# Patient Record
Sex: Female | Born: 1960 | Race: White | Hispanic: No | Marital: Single | Smoking: Former smoker
Health system: Southern US, Community
[De-identification: ages and names within clinical notes are randomized; demographics above are authoritative.]

## PROBLEM LIST (undated history)

## (undated) DIAGNOSIS — Z923 Personal history of irradiation: Secondary | ICD-10-CM

## (undated) DIAGNOSIS — F419 Anxiety disorder, unspecified: Secondary | ICD-10-CM

## (undated) DIAGNOSIS — C50919 Malignant neoplasm of unspecified site of unspecified female breast: Secondary | ICD-10-CM

## (undated) DIAGNOSIS — M51369 Other intervertebral disc degeneration, lumbar region without mention of lumbar back pain or lower extremity pain: Secondary | ICD-10-CM

## (undated) DIAGNOSIS — N879 Dysplasia of cervix uteri, unspecified: Secondary | ICD-10-CM

## (undated) DIAGNOSIS — M1990 Unspecified osteoarthritis, unspecified site: Secondary | ICD-10-CM

## (undated) DIAGNOSIS — D649 Anemia, unspecified: Secondary | ICD-10-CM

## (undated) DIAGNOSIS — M5136 Other intervertebral disc degeneration, lumbar region: Secondary | ICD-10-CM

## (undated) HISTORY — PX: WISDOM TOOTH EXTRACTION: SHX21

## (undated) HISTORY — PX: CERVIX SURGERY: SHX593

## (undated) HISTORY — DX: Anemia, unspecified: D64.9

---

## 2008-02-26 ENCOUNTER — Ambulatory Visit: Payer: Self-pay | Admitting: Unknown Physician Specialty

## 2008-12-19 ENCOUNTER — Ambulatory Visit: Payer: Self-pay | Admitting: Family Medicine

## 2009-03-05 ENCOUNTER — Ambulatory Visit: Payer: Self-pay | Admitting: Physician Assistant

## 2009-06-03 ENCOUNTER — Ambulatory Visit: Payer: Self-pay | Admitting: Internal Medicine

## 2009-06-20 ENCOUNTER — Ambulatory Visit: Payer: Self-pay | Admitting: Internal Medicine

## 2009-07-04 ENCOUNTER — Ambulatory Visit: Payer: Self-pay | Admitting: Internal Medicine

## 2010-02-19 ENCOUNTER — Ambulatory Visit: Payer: Self-pay | Admitting: Unknown Physician Specialty

## 2011-04-13 ENCOUNTER — Ambulatory Visit: Payer: Self-pay | Admitting: Family Medicine

## 2012-08-18 ENCOUNTER — Ambulatory Visit: Payer: Self-pay | Admitting: Family Medicine

## 2015-04-21 ENCOUNTER — Other Ambulatory Visit: Payer: Self-pay | Admitting: Family Medicine

## 2015-04-21 DIAGNOSIS — Z1231 Encounter for screening mammogram for malignant neoplasm of breast: Secondary | ICD-10-CM

## 2015-05-01 ENCOUNTER — Ambulatory Visit: Payer: Self-pay

## 2015-05-07 ENCOUNTER — Ambulatory Visit
Admission: RE | Admit: 2015-05-07 | Discharge: 2015-05-07 | Disposition: A | Discharge status: Home / Self Care | Payer: 59 | Source: Ambulatory Visit | Attending: Family Medicine | Admitting: Family Medicine

## 2015-05-07 DIAGNOSIS — Z1231 Encounter for screening mammogram for malignant neoplasm of breast: Secondary | ICD-10-CM | POA: Insufficient documentation

## 2016-04-05 DIAGNOSIS — Z923 Personal history of irradiation: Secondary | ICD-10-CM

## 2016-04-05 HISTORY — DX: Personal history of irradiation: Z92.3

## 2016-04-06 ENCOUNTER — Other Ambulatory Visit: Payer: Self-pay | Admitting: Family Medicine

## 2016-04-06 DIAGNOSIS — Z1231 Encounter for screening mammogram for malignant neoplasm of breast: Secondary | ICD-10-CM

## 2016-05-18 ENCOUNTER — Ambulatory Visit
Admission: RE | Admit: 2016-05-18 | Discharge: 2016-05-18 | Disposition: A | Discharge status: Home / Self Care | Payer: 59 | Source: Ambulatory Visit | Attending: Family Medicine | Admitting: Family Medicine

## 2016-05-18 DIAGNOSIS — Z1231 Encounter for screening mammogram for malignant neoplasm of breast: Secondary | ICD-10-CM | POA: Diagnosis not present

## 2016-05-18 DIAGNOSIS — R928 Other abnormal and inconclusive findings on diagnostic imaging of breast: Secondary | ICD-10-CM | POA: Diagnosis not present

## 2016-05-20 ENCOUNTER — Other Ambulatory Visit: Payer: Self-pay | Admitting: Family Medicine

## 2016-05-20 DIAGNOSIS — N6310 Unspecified lump in the right breast, unspecified quadrant: Secondary | ICD-10-CM

## 2016-05-20 DIAGNOSIS — R928 Other abnormal and inconclusive findings on diagnostic imaging of breast: Secondary | ICD-10-CM

## 2016-06-02 ENCOUNTER — Ambulatory Visit
Admission: RE | Admit: 2016-06-02 | Discharge: 2016-06-02 | Disposition: A | Discharge status: Home / Self Care | Payer: 59 | Source: Ambulatory Visit | Attending: Family Medicine | Admitting: Family Medicine

## 2016-06-02 DIAGNOSIS — N6310 Unspecified lump in the right breast, unspecified quadrant: Secondary | ICD-10-CM

## 2016-06-02 DIAGNOSIS — N6311 Unspecified lump in the right breast, upper outer quadrant: Secondary | ICD-10-CM | POA: Insufficient documentation

## 2016-06-02 DIAGNOSIS — R928 Other abnormal and inconclusive findings on diagnostic imaging of breast: Secondary | ICD-10-CM

## 2016-06-03 DIAGNOSIS — C50919 Malignant neoplasm of unspecified site of unspecified female breast: Secondary | ICD-10-CM

## 2016-06-03 HISTORY — DX: Malignant neoplasm of unspecified site of unspecified female breast: C50.919

## 2016-06-04 ENCOUNTER — Other Ambulatory Visit: Payer: Self-pay | Admitting: Family Medicine

## 2016-06-04 DIAGNOSIS — R928 Other abnormal and inconclusive findings on diagnostic imaging of breast: Secondary | ICD-10-CM

## 2016-06-04 DIAGNOSIS — N6310 Unspecified lump in the right breast, unspecified quadrant: Secondary | ICD-10-CM

## 2016-06-09 ENCOUNTER — Ambulatory Visit
Admission: RE | Admit: 2016-06-09 | Discharge: 2016-06-09 | Disposition: A | Discharge status: Home / Self Care | Payer: 59 | Source: Ambulatory Visit | Attending: Family Medicine | Admitting: Family Medicine

## 2016-06-09 DIAGNOSIS — C50411 Malignant neoplasm of upper-outer quadrant of right female breast: Secondary | ICD-10-CM | POA: Insufficient documentation

## 2016-06-09 DIAGNOSIS — N6310 Unspecified lump in the right breast, unspecified quadrant: Secondary | ICD-10-CM

## 2016-06-09 DIAGNOSIS — R928 Other abnormal and inconclusive findings on diagnostic imaging of breast: Secondary | ICD-10-CM

## 2016-06-11 HISTORY — PX: BREAST BIOPSY: SHX20

## 2016-06-14 ENCOUNTER — Encounter: Payer: Self-pay | Admitting: *Deleted

## 2016-06-15 DIAGNOSIS — C50411 Malignant neoplasm of upper-outer quadrant of right female breast: Secondary | ICD-10-CM | POA: Insufficient documentation

## 2016-06-15 NOTE — Progress Notes (Signed)
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  INTERVAL HISTORY: Patient is a 56 year old female who was noted to have an abnormality on routine screening mammogram. Subsequent ultrasound and biopsy revealed the above stated breast cancer. She is highly anxious and has some mild breast tenderness at the site of her biopsy, but otherwise feels well. She has no neurologic complaints. She denies any recent fevers or illnesses. She has a good appetite and denies weight loss. She has no chest pain or shortness of breath. She denies any nausea, vomiting, constipation, or diarrhea. She has no urinary complaints. Patient otherwise feels well and offers no further specific complaints.  REVIEW OF SYSTEMS:   Review of Systems  Constitutional: Negative.  Negative for fever, malaise/fatigue and weight loss.  Respiratory: Negative.  Negative for cough and shortness of breath.   Cardiovascular: Negative.  Negative for chest pain and leg swelling.  Gastrointestinal: Negative.  Negative for abdominal pain.  Genitourinary: Negative.   Musculoskeletal: Negative.   Neurological: Negative.   Psychiatric/Behavioral: The patient is nervous/anxious.     As per HPI. Otherwise, a complete review of systems is negative.  PAST MEDICAL HISTORY: Past Medical History:  Diagnosis Date  . Anemia     PAST SURGICAL HISTORY: No past surgical history on file.  FAMILY HISTORY: Family History  Problem Relation Age of Onset  . Breast cancer Neg Hx     ADVANCED DIRECTIVES (Y/N):  N  HEALTH MAINTENANCE: Social History  Substance Use Topics  . Smoking status: Not on file  . Smokeless tobacco: Not on file  .  Alcohol use Not on file     Colonoscopy:  PAP:  Bone density:  Lipid panel:  No Known Allergies  Current Outpatient Prescriptions  Medication Sig Dispense Refill  . ALPRAZolam (XANAX) 0.25 MG tablet Take 1 tablet by mouth daily as needed.    Marland Kitchen HYDROcodone-acetaminophen (NORCO) 10-325 MG tablet Take 1 tablet by mouth daily as needed.    . zolpidem (AMBIEN) 10 MG tablet Take 1 tablet by mouth at bedtime.     No current facility-administered medications for this visit.     OBJECTIVE: Vitals:   06/16/16 1559  BP: (!) 169/83  Pulse: 98  Resp: 18  Temp: 99.3 F (37.4 C)     There is no height or weight on file to calculate BMI.    ECOG FS:0 - Asymptomatic  General: Well-developed, well-nourished, no acute distress. Eyes: Pink conjunctiva, anicteric sclera. HEENT: Normocephalic, moist mucous membranes, clear oropharnyx. Breasts: Right breast with ecchymosis at biopsy site, no palpable masses. Axilla without lymphadenopathy. Lungs: Clear to auscultation bilaterally. Heart: Regular rate and rhythm. No rubs, murmurs, or gallops. Abdomen: Soft, nontender, nondistended. No organomegaly noted, normoactive bowel sounds. Musculoskeletal: No edema, cyanosis, or clubbing. Neuro: Alert, answering all questions appropriately. Cranial nerves grossly intact. Skin: No rashes or petechiae noted. Psych: Normal affect. Lymphatics: No cervical, calvicular, axillary or inguinal LAD.   LAB RESULTS:  No results found for: NA, K, CL, CO2, GLUCOSE, BUN, CREATININE, CALCIUM, PROT, ALBUMIN, AST, ALT, ALKPHOS, BILITOT, GFRNONAA, GFRAA  No results found for: WBC, NEUTROABS, HGB, HCT, MCV, PLT   STUDIES: Mm Digital Screening Bilateral  Result Date: 05/19/2016 CLINICAL DATA:  Screening.  EXAM: DIGITAL SCREENING BILATERAL MAMMOGRAM WITH CAD COMPARISON:  Previous exam(s). ACR Breast Density Category c: The breast tissue is heterogeneously dense, which may obscure small masses. FINDINGS: In the right  breast, a possible mass warrants further evaluation. In the left breast, no findings suspicious for malignancy. Images were processed with CAD. IMPRESSION: Further evaluation is suggested for possible mass in the right breast. RECOMMENDATION: Diagnostic mammogram and possibly ultrasound of the right breast. (Code:[id number]) The patient will be contacted regarding the findings, and additional imaging will be scheduled. BI-RADS CATEGORY  0: Incomplete. Need additional imaging evaluation and/or prior mammograms for comparison. Electronically Signed   By: Nolon Nations M.D.   On: 05/19/2016 12:32   US Breast Ltd Uni Right Inc Axilla  Result Date: 06/02/2016 CLINICAL DATA:  56 year old female for evaluation of possible right breast mass on screening mammogram. EXAM: 2D DIGITAL DIAGNOSTIC RIGHT MAMMOGRAM WITH CAD AND ADJUNCT TOMO ULTRASOUND RIGHT BREAST COMPARISON:  Previous exam(s). ACR Breast Density Category b: There are scattered areas of fibroglandular density. FINDINGS: 2D and 3D full field views of the right breast demonstrate an 8 mm irregular mass within the upper-outer right breast. No other abnormalities are identified within the right breast. Mammographic images were processed with CAD. On physical exam, focal thickening at the 10 o'clock position of the right breast 6 cm from the nipple is noted appear Targeted ultrasound is performed, showing an 8 x 6 x 8 mm irregular mass at the 10 o'clock position of the right breast 6 cm from the nipple. No abnormal right axillary lymph nodes are identified. IMPRESSION: Suspicious 8 mm mass in the upper-outer right breast. Tissue sampling is recommended. RECOMMENDATION: Ultrasound-guided right breast biopsy, which will be arranged. I have discussed the findings and recommendations with the patient. Results were also provided in writing at the conclusion of the visit. If applicable, a reminder letter will be sent to the patient regarding the next appointment. BI-RADS  CATEGORY  4: Suspicious. Electronically Signed   By: Margarette Canada M.D.   On: 06/02/2016 16:11   Mm Diag Breast Tomo Uni Right  Result Date: 06/02/2016 CLINICAL DATA:  56 year old female for evaluation of possible right breast mass on screening mammogram. EXAM: 2D DIGITAL DIAGNOSTIC RIGHT MAMMOGRAM WITH CAD AND ADJUNCT TOMO ULTRASOUND RIGHT BREAST COMPARISON:  Previous exam(s). ACR Breast Density Category b: There are scattered areas of fibroglandular density. FINDINGS: 2D and 3D full field views of the right breast demonstrate an 8 mm irregular mass within the upper-outer right breast. No other abnormalities are identified within the right breast. Mammographic images were processed with CAD. On physical exam, focal thickening at the 10 o'clock position of the right breast 6 cm from the nipple is noted appear Targeted ultrasound is performed, showing an 8 x 6 x 8 mm irregular mass at the 10 o'clock position of the right breast 6 cm from the nipple. No abnormal right axillary lymph nodes are identified. IMPRESSION: Suspicious 8 mm mass in the upper-outer right breast. Tissue sampling is recommended. RECOMMENDATION: Ultrasound-guided right breast biopsy, which will be arranged. I have discussed the findings and recommendations with the patient. Results were also provided in writing at the conclusion of the visit. If applicable, a reminder letter will be sent to the patient regarding the next appointment. BI-RADS CATEGORY  4: Suspicious. Electronically Signed   By: Margarette Canada M.D.   On: 06/02/2016 16:11   Mm Clip Placement Right  Result Date: 06/09/2016 CLINICAL DATA:  56 year old status post ultrasound-guided right  breast biopsy EXAM: DIAGNOSTIC RIGHT MAMMOGRAM POST ULTRASOUND BIOPSY COMPARISON:  Previous exam(s). FINDINGS: Mammographic images were obtained following ultrasound guided biopsy of an indeterminate right breast mass at 10 o'clock, 6 cm from the nipple. Post biopsy mammogram demonstrates the coil  shaped biopsy marker to be in the expected location within the upper, outer right breast. IMPRESSION: Appropriate marker position as above. Final Assessment: Post Procedure Mammograms for Marker Placement Electronically Signed   By: Pamelia Hoit M.D.   On: 06/09/2016 10:13   [place] Rt Breast Bx W Loc Dev 1st Lesion Img Bx Spec US Guide  Addendum Date: 06/11/2016   ADDENDUM REPORT: 06/11/2016 13:27 ADDENDUM: Pathology results from the right breast ultrasound-guided biopsy showed invasive mammary carcinoma no special type. Histologic grade of invasive carcinoma is grade 2. Pathology results are concordant with imaging findings. I telephoned Ms. Pendergraph with pathology results today and answered her questions. She reports doing well after the biopsy, with some focal bruising at the biopsy site. She will be contacted by the nurse navigator seen, who will make a surgical referral for her. Electronically Signed   By: Curlene Dolphin M.D.   On: 06/11/2016 13:27   Result Date: 06/11/2016 CLINICAL DATA:  56 year old female for ultrasound-guided biopsy of an indeterminate right breast mass at 10 o'clock, 6 cm from the nipple EXAM: ULTRASOUND GUIDED RIGHT BREAST CORE NEEDLE BIOPSY COMPARISON:  Previous exam(s). FINDINGS: I met with the patient and we discussed the procedure of ultrasound-guided biopsy, including benefits and alternatives. We discussed the high likelihood of a successful procedure. We discussed the risks of the procedure, including infection, bleeding, tissue injury, clip migration, and inadequate sampling. Informed written consent was given. The usual time-out protocol was performed immediately prior to the procedure. Using sterile technique and 1% Lidocaine as local anesthetic, under direct ultrasound visualization, a 12 gauge spring-loaded device was used to perform biopsy of an indeterminate right breast mass at 10 o'clock 6 cm from the nipple using a lateral to medial approach. At the conclusion of the procedure  a coil shaped tissue marker clip was deployed into the biopsy cavity. Follow up 2 view mammogram was performed and dictated separately. IMPRESSION: Ultrasound guided biopsy of an indeterminate right breast mass at 10 o'clock, 6 cm from nipple. No apparent complications. Electronically Signed: By: Pamelia Hoit M.D. 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  PLAN:    1. 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 It is unclear whether patient will require adjuvant chemotherapy and will wait the [place] results of her HER-2 testing. If negative, will send pathology for Oncotype DX score. If positive patient may require adjuvant chemotherapy. Patient will also require adjuvant XRT if she continues with lumpectomy. Given patient's ER/PR status, she will also require aromatase inhibitor for 5 years. Patient will return to clinic one to 2 tabs after her surgery to discuss the final pathology results and treatment planning.  Patient expressed understanding and was in agreement with this plan. She also understands that She can call clinic at any time with any questions, concerns, or complaints.   Approximately 45 minutes was spent in discussion of which greater than 50% was consultation.  Cancer Staging Primary cancer of upper  outer quadrant of right female breast [facility]) Staging form: Breast, AJCC 8th Edition - Clinical stage from 06/15/2016: Stage IA (cT1b, cN0, cM0, G2, ER: Positive, PR: Positive, HER2: Equivocal) - Signed by Lloyd Huger, MD on 06/15/2016   Lloyd Huger, MD   06/16/2016 5:34 PM

## 2016-06-16 ENCOUNTER — Inpatient Hospital Stay: Payer: 59 | Attending: Oncology | Admitting: Oncology

## 2016-06-16 ENCOUNTER — Encounter: Payer: Self-pay | Admitting: Oncology

## 2016-06-16 ENCOUNTER — Encounter: Payer: Self-pay | Admitting: *Deleted

## 2016-06-16 DIAGNOSIS — C50411 Malignant neoplasm of upper-outer quadrant of right female breast: Secondary | ICD-10-CM | POA: Insufficient documentation

## 2016-06-16 DIAGNOSIS — Z170 Estrogen receptor positive status [ER+]: Secondary | ICD-10-CM | POA: Diagnosis not present

## 2016-06-16 NOTE — Progress Notes (Signed)
New evaluation for breast cancer. Offers no complaints. States has mild bruising around biopsy site and would like MD to evaluated if possible.

## 2016-06-17 ENCOUNTER — Encounter: Payer: Self-pay | Admitting: *Deleted

## 2016-06-17 NOTE — Progress Notes (Signed)
  Oncology Nurse Navigator Documentation  Navigator Location: [facility] (06/14/16 1400) Referral date to RadOnc/MedOnc: 06/17/16 (06/14/16 1400) )Navigator Encounter Type: Introductory phone call (06/14/16 1400)                         Barriers/Navigation Needs: Coordination of Care (06/14/16 1400)       Coordination of Care: Appts (06/14/16 1400)       Called and introduced navigation services.  I have scheduled patient to see Dr. Grayland Ormond on 06/17/16 @ 8:30.  Unable to schedule appointment to see Dr. Tamala Julian since their office is closed this afternoon.  Will schedule her tomorrow.  Will give educational literature at the time of her appointment.  She is to call will any questions or needs.           Time Spent with Patient: 45 (06/14/16 1400)

## 2016-06-17 NOTE — Progress Notes (Signed)
  Oncology Nurse Navigator Documentation  Navigator Location: [facility] (06/17/16 1100) Referral date to RadOnc/MedOnc: 06/17/16 (06/17/16 1100) )Navigator Encounter Type: Initial MedOnc (06/17/16 1100)   Abnormal Finding Date: 06/02/16 (06/17/16 1100) Confirmed Diagnosis Date: 06/10/16 (06/17/16 1100)               Patient Visit Type: MedOnc (06/17/16 1100) Treatment Phase: Pre-Tx/Tx Discussion (06/17/16 1100) Barriers/Navigation Needs: Education (06/17/16 1100) Education: Understanding Cancer/ Treatment Options;Coping with Diagnosis/ Prognosis;Newly Diagnosed Cancer Education (06/17/16 1100) Interventions: Education (06/17/16 1100)     Education Method: Verbal;Written (06/17/16 1100)  Support Groups/Services: Breast Support Group (06/17/16 1100)   Acuity: Level 2 (06/17/16 1100)         Time Spent with Patient: 60 (06/17/16 1100)   Met patient and her friend during her initial medical oncology visit with Dr. Grayland Ormond.  Patient is very anxious. Offered support.  Gave patient breast cancer educational literature, "My Breast Cancer Treatment Handbook" by Josephine Igo, RN.  Reviewed support services here at the cancer center.  Patient is scheduled to see Dr. Tamala Julian on Monday.  Final treatment plan after surgery and Her2 neu results are complete.  She is to call with any question or needs.

## 2016-06-17 NOTE — Progress Notes (Signed)
  Oncology Nurse Navigator Documentation  Navigator Location: [facility] (06/17/16 1200)   )Navigator Encounter Type: Telephone (06/17/16 1200) Telephone: Incoming Call (06/17/16 1200)                       Barriers/Navigation Needs: Financial (06/17/16 1200) Education: Coping with Diagnosis/ Prognosis (06/17/16 1200)              Acuity: Level 2 (06/17/16 1200)    Patient called and wanted to know her final her2 status.  Informed her the results were not available today, but to call Webb Silversmith tomorrow if she wanted to.  Had many questions about financial assistance.  Informed we could help with rent / power bill, but for medical bill assistance she would need to complete paperwork for [facility].  Informed she could talk with Jacqualin Combes.  She is going to come by and bring some bills.       Time Spent with Patient: 15 (06/17/16 1200)

## 2016-06-17 NOTE — Progress Notes (Signed)
  Oncology Nurse Navigator Documentation  Navigator Location: [facility] (06/15/16 1222)   )Navigator Encounter Type: Telephone (06/15/16 1222) Telephone: Incoming Call;Outgoing Call;Appt Confirmation/Clarification;Education (06/15/16 1222)                     Treatment Phase: Pre-Tx/Tx Discussion (06/15/16 1222) Barriers/Navigation Needs: Coordination of Care;Education (06/15/16 1222)       Coordination of Care: Appts (06/15/16 1222)                  Time Spent with Patient: 30 (06/15/16 1222)   Patient phoned with questions about date of Surgical Consult. Very anxious.   Scheduled patient to see Dr.  Tamala Julian on 06/21/16 at 2:30 p.m.

## 2016-06-18 LAB — SURGICAL PATHOLOGY

## 2016-06-21 ENCOUNTER — Encounter: Payer: Self-pay | Admitting: Diagnostic Radiology

## 2016-06-22 ENCOUNTER — Other Ambulatory Visit: Payer: Self-pay | Admitting: Surgery

## 2016-06-22 DIAGNOSIS — C50411 Malignant neoplasm of upper-outer quadrant of right female breast: Secondary | ICD-10-CM

## 2016-06-22 DIAGNOSIS — Z170 Estrogen receptor positive status [ER+]: Principal | ICD-10-CM

## 2016-07-01 ENCOUNTER — Encounter
Admission: RE | Admit: 2016-07-01 | Discharge: 2016-07-01 | Disposition: A | Discharge status: Home / Self Care | Payer: 59 | Source: Ambulatory Visit | Attending: Surgery | Admitting: Surgery

## 2016-07-01 HISTORY — DX: Dysplasia of cervix uteri, unspecified: N87.9

## 2016-07-01 HISTORY — DX: Other intervertebral disc degeneration, lumbar region without mention of lumbar back pain or lower extremity pain: M51.369

## 2016-07-01 HISTORY — DX: Anxiety disorder, unspecified: F41.9

## 2016-07-01 HISTORY — DX: Other intervertebral disc degeneration, lumbar region: M51.36

## 2016-07-01 HISTORY — DX: Unspecified osteoarthritis, unspecified site: M19.90

## 2016-07-01 NOTE — Patient Instructions (Signed)
  Your procedure is scheduled on: 07-08-16 Report to [facility] @ 7:45 AM  Remember: Instructions that are not followed completely may result in serious medical risk, up to and including death, or upon the discretion of your surgeon and anesthesiologist your surgery may need to be rescheduled.    _x___ 1. Do not eat food or drink liquids after midnight. No gum chewing or hard candies.     __x__ 2. No Alcohol for 24 hours before or after surgery.   __x__3. No Smoking for 24 prior to surgery.   ____  4. Bring all medications with you on the day of surgery if instructed.    __x__ 5. Notify your doctor if there is any change in your medical condition     (cold, fever, infections).     Do not wear jewelry, make-up, hairpins, clips or nail polish.  Do not wear lotions, powders, or perfumes. You may wear deodorant.  Do not shave 48 hours prior to surgery. Men may shave face and neck.  Do not bring valuables to the hospital.    [facility] is not responsible for any belongings or valuables.               Contacts, dentures or bridgework may not be worn into surgery.  Leave your suitcase in the car. After surgery it may be brought to your room.  For patients admitted to the hospital, discharge time is determined by your treatment team.   Patients discharged the day of surgery will not be allowed to drive home.  You will need someone to drive you home and stay with you the night of your procedure.    Please read over the following fact sheets that you were given:      _x___ Take anti-hypertensive (unless it includes a diuretic), cardiac, seizure, asthma,     anti-reflux and psychiatric medicines. These include:  1. XANAX  2. MAY TAKE TRAMADOL/HYDROCODONE IF NEEDED-PT WANTING TO TAKE TRAMADOL AM OF SURGERY DUE TO CHRONIC BACK PAIN  3.  4.  5.  6.  ____Fleets enema or Magnesium Citrate as directed.   ____ Use CHG Soap or sage wipes as directed on instruction sheet   ____ Use  inhalers on the day of surgery and bring to hospital day of surgery  ____ Stop Metformin and Janumet 2 days prior to surgery.    ____ Take 1/2 of usual insulin dose the night before surgery and none on the morning     surgery.   ____ Follow recommendations from Cardiologist, Pulmonologist or PCP regarding stopping Aspirin, Coumadin, Pllavix ,Eliquis, Effient, or Pradaxa, and Pletal.  X____Stop Anti-inflammatories such as Advil, Aleve, Ibuprofen, Motrin, Naproxen, Naprosyn, Goodies powders or aspirin products. OK to take HYDROCODONE OR TRAMADOL IF NEEDED   ____ Stop supplements until after surgery.     ____ Bring C-Pap to the hospital.

## 2016-07-08 ENCOUNTER — Ambulatory Visit: Payer: 59 | Admitting: Certified Registered Nurse Anesthetist

## 2016-07-08 ENCOUNTER — Encounter
Admission: RE | Disposition: A | Discharge status: Home / Self Care | Payer: Self-pay | Source: Ambulatory Visit | Attending: Surgery

## 2016-07-08 ENCOUNTER — Ambulatory Visit
Admission: RE | Admit: 2016-07-08 | Discharge: 2016-07-08 | Disposition: A | Discharge status: Home / Self Care | Payer: 59 | Source: Ambulatory Visit | Attending: Surgery | Admitting: Surgery

## 2016-07-08 ENCOUNTER — Encounter: Payer: Self-pay | Admitting: *Deleted

## 2016-07-08 DIAGNOSIS — C50411 Malignant neoplasm of upper-outer quadrant of right female breast: Secondary | ICD-10-CM

## 2016-07-08 DIAGNOSIS — Z806 Family history of leukemia: Secondary | ICD-10-CM | POA: Diagnosis not present

## 2016-07-08 DIAGNOSIS — D649 Anemia, unspecified: Secondary | ICD-10-CM | POA: Insufficient documentation

## 2016-07-08 DIAGNOSIS — Z87891 Personal history of nicotine dependence: Secondary | ICD-10-CM | POA: Insufficient documentation

## 2016-07-08 DIAGNOSIS — C50911 Malignant neoplasm of unspecified site of right female breast: Secondary | ICD-10-CM | POA: Diagnosis present

## 2016-07-08 DIAGNOSIS — F419 Anxiety disorder, unspecified: Secondary | ICD-10-CM | POA: Diagnosis not present

## 2016-07-08 DIAGNOSIS — Z170 Estrogen receptor positive status [ER+]: Secondary | ICD-10-CM | POA: Insufficient documentation

## 2016-07-08 DIAGNOSIS — Z8042 Family history of malignant neoplasm of prostate: Secondary | ICD-10-CM | POA: Diagnosis not present

## 2016-07-08 DIAGNOSIS — Z79899 Other long term (current) drug therapy: Secondary | ICD-10-CM | POA: Diagnosis not present

## 2016-07-08 DIAGNOSIS — G43909 Migraine, unspecified, not intractable, without status migrainosus: Secondary | ICD-10-CM | POA: Insufficient documentation

## 2016-07-08 DIAGNOSIS — M792 Neuralgia and neuritis, unspecified: Secondary | ICD-10-CM | POA: Insufficient documentation

## 2016-07-08 DIAGNOSIS — Z8249 Family history of ischemic heart disease and other diseases of the circulatory system: Secondary | ICD-10-CM | POA: Insufficient documentation

## 2016-07-08 HISTORY — PX: BREAST LUMPECTOMY: SHX2

## 2016-07-08 HISTORY — PX: SENTINEL NODE BIOPSY: SHX6608

## 2016-07-08 HISTORY — DX: Malignant neoplasm of unspecified site of unspecified female breast: C50.919

## 2016-07-08 HISTORY — PX: PARTIAL MASTECTOMY WITH NEEDLE LOCALIZATION: SHX6008

## 2016-07-08 SURGERY — PARTIAL MASTECTOMY WITH NEEDLE LOCALIZATION
Anesthesia: General | Laterality: Right

## 2016-07-08 MED ORDER — ONDANSETRON HCL 4 MG/2ML IJ SOLN
INTRAMUSCULAR | Status: AC
  Filled 2016-07-08: qty 2

## 2016-07-08 MED ORDER — PHENYLEPHRINE HCL 10 MG/ML IJ SOLN
INTRAMUSCULAR | Status: DC | PRN
  Administered 2016-07-08: 100 ug via INTRAVENOUS

## 2016-07-08 MED ORDER — TECHNETIUM TC 99M SULFUR COLLOID FILTERED
1.0000 | Freq: Once | INTRAVENOUS | Status: AC | PRN
  Administered 2016-07-08: 0.823 via INTRADERMAL

## 2016-07-08 MED ORDER — LIDOCAINE HCL (PF) 2 % IJ SOLN
INTRAMUSCULAR | Status: AC
  Filled 2016-07-08: qty 2

## 2016-07-08 MED ORDER — LIDOCAINE HCL (CARDIAC) 20 MG/ML IV SOLN
INTRAVENOUS | Status: DC | PRN
  Administered 2016-07-08: 50 mg via INTRAVENOUS

## 2016-07-08 MED ORDER — HYDROCODONE-ACETAMINOPHEN 5-325 MG PO TABS
1.0000 | ORAL_TABLET | ORAL | Status: DC | PRN
  Administered 2016-07-08: 1 via ORAL

## 2016-07-08 MED ORDER — DEXAMETHASONE SODIUM PHOSPHATE 10 MG/ML IJ SOLN
INTRAMUSCULAR | Status: AC
  Filled 2016-07-08: qty 1

## 2016-07-08 MED ORDER — LACTATED RINGERS IV SOLN
INTRAVENOUS | Status: DC
  Administered 2016-07-08: 50 mL/h via INTRAVENOUS

## 2016-07-08 MED ORDER — ONDANSETRON HCL 4 MG/2ML IJ SOLN: 4.0000 mg | Freq: Once | INTRAMUSCULAR | Status: DC | PRN

## 2016-07-08 MED ORDER — FAMOTIDINE 20 MG PO TABS
20.0000 mg | ORAL_TABLET | Freq: Once | ORAL | Status: AC
  Administered 2016-07-08: 20 mg via ORAL

## 2016-07-08 MED ORDER — DEXAMETHASONE SODIUM PHOSPHATE 10 MG/ML IJ SOLN
INTRAMUSCULAR | Status: DC | PRN
  Administered 2016-07-08: 10 mg via INTRAVENOUS

## 2016-07-08 MED ORDER — MIDAZOLAM HCL 2 MG/2ML IJ SOLN: 1.0000 mg | Freq: Once | INTRAMUSCULAR | Status: DC

## 2016-07-08 MED ORDER — ONDANSETRON HCL 4 MG/2ML IJ SOLN
INTRAMUSCULAR | Status: DC | PRN
  Administered 2016-07-08: 4 mg via INTRAVENOUS

## 2016-07-08 MED ORDER — METHYLENE BLUE 0.5 % INJ SOLN
INTRAVENOUS | Status: AC
  Filled 2016-07-08: qty 10

## 2016-07-08 MED ORDER — BUPIVACAINE-EPINEPHRINE (PF) 0.5% -1:200000 IJ SOLN
INTRAMUSCULAR | Status: AC
  Filled 2016-07-08: qty 30

## 2016-07-08 MED ORDER — PROPOFOL 10 MG/ML IV BOLUS
INTRAVENOUS | Status: AC
  Filled 2016-07-08: qty 20

## 2016-07-08 MED ORDER — EPHEDRINE SULFATE 50 MG/ML IJ SOLN
INTRAMUSCULAR | Status: DC | PRN
  Administered 2016-07-08: 5 mg via INTRAVENOUS

## 2016-07-08 MED ORDER — FAMOTIDINE 20 MG PO TABS
ORAL_TABLET | ORAL | Status: AC
  Administered 2016-07-08: 20 mg via ORAL
  Filled 2016-07-08: qty 1

## 2016-07-08 MED ORDER — FENTANYL CITRATE (PF) 100 MCG/2ML IJ SOLN
INTRAMUSCULAR | Status: AC
  Administered 2016-07-08: 25 ug via INTRAVENOUS
  Filled 2016-07-08: qty 2

## 2016-07-08 MED ORDER — FENTANYL CITRATE (PF) 100 MCG/2ML IJ SOLN
INTRAMUSCULAR | Status: AC
  Filled 2016-07-08: qty 2

## 2016-07-08 MED ORDER — FENTANYL CITRATE (PF) 100 MCG/2ML IJ SOLN
25.0000 ug | INTRAMUSCULAR | Status: DC | PRN
  Administered 2016-07-08 (×3): 25 ug via INTRAVENOUS

## 2016-07-08 MED ORDER — FENTANYL CITRATE (PF) 100 MCG/2ML IJ SOLN: 50.0000 ug | Freq: Once | INTRAMUSCULAR | Status: DC

## 2016-07-08 MED ORDER — MIDAZOLAM HCL 2 MG/2ML IJ SOLN
INTRAMUSCULAR | Status: AC
  Filled 2016-07-08: qty 2

## 2016-07-08 MED ORDER — FENTANYL CITRATE (PF) 100 MCG/2ML IJ SOLN
INTRAMUSCULAR | Status: DC | PRN
  Administered 2016-07-08 (×4): 50 ug via INTRAVENOUS

## 2016-07-08 MED ORDER — BUPIVACAINE-EPINEPHRINE 0.5% -1:200000 IJ SOLN
INTRAMUSCULAR | Status: DC | PRN
  Administered 2016-07-08: 8 mL

## 2016-07-08 MED ORDER — HYDROCODONE-ACETAMINOPHEN 5-325 MG PO TABS
ORAL_TABLET | ORAL | Status: AC
  Filled 2016-07-08: qty 1

## 2016-07-08 MED ORDER — MIDAZOLAM HCL 2 MG/2ML IJ SOLN
INTRAMUSCULAR | Status: DC | PRN
  Administered 2016-07-08 (×2): 1 mg via INTRAVENOUS

## 2016-07-08 MED ORDER — PROPOFOL 10 MG/ML IV BOLUS
INTRAVENOUS | Status: DC | PRN
  Administered 2016-07-08: 180 mg via INTRAVENOUS

## 2016-07-08 SURGICAL SUPPLY — 32 items
BLADE SURG 15 STRL LF DISP TIS (BLADE) ×1 IMPLANT
BLADE SURG 15 STRL SS (BLADE) ×2
CANISTER SUCT 1200ML W/VALVE (MISCELLANEOUS) ×3 IMPLANT
CHLORAPREP W/TINT 26ML (MISCELLANEOUS) ×3 IMPLANT
CNTNR SPEC 2.5X3XGRAD LEK (MISCELLANEOUS) ×2
CONT SPEC 4OZ STER OR WHT (MISCELLANEOUS) ×4
CONTAINER SPEC 2.5X3XGRAD LEK (MISCELLANEOUS) ×2 IMPLANT
DERMABOND ADVANCED (GAUZE/BANDAGES/DRESSINGS) ×2
DERMABOND ADVANCED .7 DNX12 (GAUZE/BANDAGES/DRESSINGS) ×1 IMPLANT
DEVICE DUBIN SPECIMEN MAMMOGRA (MISCELLANEOUS) ×3 IMPLANT
DRAPE LAPAROTOMY 77X122 PED (DRAPES) ×3 IMPLANT
ELECT REM PT RETURN 9FT ADLT (ELECTROSURGICAL) ×3
ELECTRODE REM PT RTRN 9FT ADLT (ELECTROSURGICAL) ×1 IMPLANT
GLOVE BIO SURGEON STRL SZ7.5 (GLOVE) ×3 IMPLANT
GOWN STRL REUS W/ TWL LRG LVL3 (GOWN DISPOSABLE) ×2 IMPLANT
GOWN STRL REUS W/TWL LRG LVL3 (GOWN DISPOSABLE) ×4
KIT RM TURNOVER STRD PROC AR (KITS) ×3 IMPLANT
LABEL OR SOLS (LABEL) ×3 IMPLANT
MARGIN MAP 10MM (MISCELLANEOUS) ×3 IMPLANT
NDL SAFETY 18GX1.5 (NEEDLE) ×3 IMPLANT
NDL SAFETY 22GX1.5 (NEEDLE) ×3 IMPLANT
NEEDLE HYPO 25X1 1.5 SAFETY (NEEDLE) ×3 IMPLANT
PACK BASIN MINOR ARMC (MISCELLANEOUS) ×3 IMPLANT
SLEVE PROBE SENORX GAMMA FIND (MISCELLANEOUS) ×3 IMPLANT
SUT CHROMIC 4 0 RB 1X27 (SUTURE) ×3 IMPLANT
SUT ETHILON 3-0 FS-10 30 BLK (SUTURE) ×3
SUT MNCRL 4-0 (SUTURE) ×2
SUT MNCRL 4-0 27XMFL (SUTURE) ×1
SUTURE EHLN 3-0 FS-10 30 BLK (SUTURE) ×1 IMPLANT
SUTURE MNCRL 4-0 27XMF (SUTURE) ×1 IMPLANT
SYRINGE 10CC LL (SYRINGE) ×3 IMPLANT
WATER STERILE IRR 1000ML POUR (IV SOLUTION) ×3 IMPLANT

## 2016-07-08 NOTE — Progress Notes (Signed)
  Oncology Nurse Navigator Documentation  Navigator Location: [facility] (07/08/16 0900)   )Navigator Encounter Type: Other (07/08/16 0900)       Surgery Date: 07/08/16 (07/08/16 0900)           Treatment Initiated Date: 07/08/16 (07/08/16 0900) Patient Visit Type: Surgery (07/08/16 0900)        Met patient today after her needle localization prior to surgery.  Offered support.  She request assistance with her rent.  She is to bring in her rent agreement and a W-9.  States she will drop if off to me.              Acuity: Level 2 (07/08/16 0900)         Time Spent with Patient: 30 (07/08/16 0900)

## 2016-07-08 NOTE — Discharge Instructions (Addendum)
Take Tylenol or hydrocodone or tramadol if needed for pain.  Should not drive or do anything dangerous when taking hydrocodone or tramadol.  May shower.  Avoid needle sticks and blood pressure checks in the right arm.  AMBULATORY SURGERY  DISCHARGE INSTRUCTIONS   1) The drugs that you were given will stay in your system until tomorrow so for the next 24 hours you should not:  A) Drive an automobile B) Make any legal decisions C) Drink any alcoholic beverage   2) You may resume regular meals tomorrow.  Today it is better to start with liquids and gradually work up to solid foods.  You may eat anything you prefer, but it is better to start with liquids, then soup and crackers, and gradually work up to solid foods.   3) Please notify your doctor immediately if you have any unusual bleeding, trouble breathing, redness and pain at the surgery site, drainage, fever, or pain not relieved by medication.    4) Additional Instructions:        Please contact your physician with any problems or Same Day Surgery at [phone], Monday through Friday 6 am to 4 pm, or [place] at [facility] number at [phone].

## 2016-07-08 NOTE — Anesthesia Postprocedure Evaluation (Signed)
Anesthesia Post Note  Patient: Erin Stevens  Procedure(s) Performed: Procedure(s) (LRB): PARTIAL MASTECTOMY WITH NEEDLE LOCALIZATION (Right) SENTINEL NODE BIOPSY (Right)  Patient location during evaluation: PACU Anesthesia Type: General Level of consciousness: awake and alert Pain management: pain level controlled Vital Signs Assessment: post-procedure vital signs reviewed and stable Respiratory status: spontaneous breathing, nonlabored ventilation, respiratory function stable and patient connected to nasal cannula oxygen Cardiovascular status: blood pressure returned to baseline and stable Postop Assessment: no signs of nausea or vomiting Anesthetic complications: no     Last Vitals:  Vitals:   07/08/16 1445 07/08/16 1455  BP: (!) 145/70 (!) 149/66  Pulse: (!) 101 91  Resp: (!) 22 15  Temp:  37.2 C    Last Pain:  Vitals:   07/08/16 1455  TempSrc:   PainSc: 4                  Joseph K Piscitello

## 2016-07-08 NOTE — Anesthesia Procedure Notes (Signed)
Procedure Name: LMA Insertion Performed by: Johnna Acosta Pre-anesthesia Checklist: Patient identified, Emergency Drugs available, Suction available, Patient being monitored and Timeout performed Patient Re-evaluated:Patient Re-evaluated prior to inductionOxygen Delivery Method: Circle system utilized Preoxygenation: Pre-oxygenation with 100% oxygen Intubation Type: IV induction LMA: LMA inserted LMA Size: 3.0 Tube type: Oral Number of attempts: 1 Dental Injury: Teeth and Oropharynx as per pre-operative assessment

## 2016-07-08 NOTE — Transfer of Care (Signed)
Immediate Anesthesia Transfer of Care Note  Patient: Erin Stevens  Procedure(s) Performed: Procedure(s): PARTIAL MASTECTOMY WITH NEEDLE LOCALIZATION (Right) SENTINEL NODE BIOPSY (Right)  Patient Location: PACU  Anesthesia Type:General  Level of Consciousness: awake and alert   Airway & Oxygen Therapy: Patient Spontanous Breathing and Patient connected to nasal cannula oxygen  Post-op Assessment: Report given to RN and Post -op Vital signs reviewed and stable  Post vital signs: Reviewed and stable  Last Vitals:  Vitals:   07/08/16 1029 07/08/16 1416  BP: 137/63 136/67  Pulse: 91 (!) 117  Resp: 16 16  Temp: 36.2 C 37.1 C    Last Pain:  Vitals:   07/08/16 1029  TempSrc: Tympanic  PainSc: 5       Patients Stated Pain Goal: 1 (69/62/95 2841)  Complications: No apparent anesthesia complications

## 2016-07-08 NOTE — OR Nursing (Signed)
Patient had previously spoken of being very anxious about surgery and in pain from her back. Patient did not take her usual dose of tramadol at home this morning and was asking for something to help both situations.  Dr. Marcello Moores notified and ordered preop med of versed and fentanyl. Patient received with minimal relief of anxiety but some relief of pain.

## 2016-07-08 NOTE — Anesthesia Preprocedure Evaluation (Signed)
Anesthesia Evaluation  Patient identified by MRN, date of birth, ID band Patient awake    Reviewed: Allergy & Precautions, NPO status , Patient's Chart, lab work & pertinent test results, reviewed documented beta blocker date and time   Airway Mallampati: II  TM Distance: >3 FB     Dental  (+) Chipped   Pulmonary former smoker,           Cardiovascular      Neuro/Psych Anxiety    GI/Hepatic   Endo/Other    Renal/GU      Musculoskeletal   Abdominal   Peds  Hematology   Anesthesia Other Findings   Reproductive/Obstetrics                             Anesthesia Physical Anesthesia Plan  ASA: II  Anesthesia Plan: General   Post-op Pain Management:    Induction: Intravenous  Airway Management Planned: LMA  Additional Equipment:   Intra-op Plan:   Post-operative Plan:   Informed Consent: I have reviewed the patients History and Physical, chart, labs and discussed the procedure including the risks, benefits and alternatives for the proposed anesthesia with the patient or authorized representative who has indicated his/her understanding and acceptance.     Plan Discussed with: CRNA  Anesthesia Plan Comments:         Anesthesia Quick Evaluation

## 2016-07-08 NOTE — OR Nursing (Signed)
Dr. Tamala Julian into see pt, OK to dc home.

## 2016-07-08 NOTE — Op Note (Signed)
OPERATIVE REPORT  PREOPERATIVE  DIAGNOSIS: Right breast cancer  POSTOPERATIVE DIAGNOSIS: Right breast cancer  PROCEDURE: Right partial mastectomy with sentinel lymph node biopsy   ANESTHESIA:  General  SURGEON: Rochel Brome  MD   INDICATIONS: .She had recent findings of a mass of the 10:00 position of the right breast. Biopsy demonstrated infiltrating mammary carcinoma. Surgery was recommended for definitive treatment.  With the patient on the operating table in the supine position under general anesthesia the dressing was removed from the right breast. The Kopans wire was cut two centimeters from the skin. The right arm was placed on a lateral arm support. The breast was prepared with Chloro Prep and draped in a sterile manner. An incision was made from the 9 o'clock position to the 11 o'clock position, 6 centimeters from the nipple. A narrow ellipse of skin was excised with the underlying specimen, the dissection was carried out with electrical cautery. The mass with surrounding breast tissue was excised, margin maps were used to map the medial, lateral, cranial, caudal, and deep margins. The specimen was submitted for specimen mammogram, and pathology to check for margins. One clamped bleeding point was suture ligated with 4-0 chromic. Hemostasis was intact.   The gamma counter was used to demonstrate radioactivity in the inferior aspect of the axilla. A three centimeter incision was made in the inferior aspect of the axilla and dissected down through subcutaneous tissue using electric cautery for hemostasis. A lymph node was encountered, deep within the axilla adjacent to the rib cage and was excised. The Ex vivo count was in the range of 1100-1250 counts/sec. This was submitted as the sentinel lymph node for pathology. The background count was minimal. There was no remaining palpable mass within the axilla.   The specimen mammogram demonstrated the presence of the biopsy marker in the  specimen. The pathologist called to report the margins were negative for cancer. The axillary wound was infiltrated with half percent Sensorcaine with epinephrine. The axillary wound was closed with running 4-0 Monocryl subcuticular suture.   The breast wound was also infiltrated with half percent Sensorcaine with epinephrine. The deeper portion of the breast wound were approximated with 4-0 chromic. Subcutaneous tissues were approximated with subcuticular 4-0 chromic. The skin was closed with 4-0 Monocryl subcuticular suture. Both wounds were treated with Dermabond. The patient tolerated the procedure satisfactory, and then was prepared for a transfer to the recovery room.   Rochel Brome, MD

## 2016-07-08 NOTE — Anesthesia Post-op Follow-up Note (Cosign Needed)
Anesthesia QCDR form completed.        

## 2016-07-08 NOTE — Anesthesia Procedure Notes (Signed)
Performed by: Shye Doty       

## 2016-07-09 ENCOUNTER — Encounter: Payer: Self-pay | Admitting: Surgery

## 2016-07-09 LAB — SURGICAL PATHOLOGY

## 2016-07-14 NOTE — Progress Notes (Signed)
  Oncology Nurse Navigator Documentation  Navigator Location: [facility] (07/14/16 0900)   )Navigator Encounter Type: Telephone;Other (Navigator appointment) (07/14/16 0900) Telephone: Lahoma Crocker Call;Appt Confirmation/Clarification;Financial Assistance (07/14/16 0900)                   Patient Visit Type: Follow-up;Walk-in (07/14/16 0900) Treatment Phase: Active Tx (07/14/16 0900) Barriers/Navigation Needs: Financial;Coordination of Care (07/14/16 0900)   Interventions: Coordination of Care;Other ([employer] assistance) (07/14/16 0900)   Coordination of Care: Appts (07/14/16 0900)        Acuity: Level 3 (07/14/16 0900)     Acuity Level 3: Ongoing guidance and education provided throughout treatment;Coordination of multimodality treatment (07/14/16 0900)   Time Spent with Patient: 60 (07/14/16 0900)   Met with patient about financial assistance through [employer].  Contacted her Rental agency with commitment to pay rent for May.  Explained assistance programs to patient.  She will need reinforcement on this for better understanding, and encouraged her to call with any needs or concerns. Patient states she has post-op visit with Dr. Tamala Julian on 07/22/16. Requested follow-up appointment with Dr. Grayland Ormond on this date.  Sent in basket message to scheduler for follow-up appointment with Dr. Grayland Ormond.  Per Mickel Baas CMA, Oncotype results were sent earlier this week, so appointment requested around 07/29/16 to ensure results are back.  If results obtained earlier, may move appointment up.  Phoned patient to explain reason for later appointment.

## 2016-07-14 NOTE — Progress Notes (Signed)
Phoned patient with follow-up appointment with Dr. Grayland Ormond on 07/29/16 at 10:45.

## 2016-07-21 ENCOUNTER — Encounter: Payer: Self-pay | Admitting: Oncology

## 2016-07-21 NOTE — Progress Notes (Signed)
  Oncology Nurse Navigator Documentation  Navigator Location: [facility] (07/21/16 1100)   )Navigator Encounter Type: Telephone (07/21/16 1100) Telephone: Incoming Call;Outgoing Call;Appt Confirmation/Clarification;Diagnostic Results (07/21/16 1100)                   Patient Visit Type: Follow-up (07/21/16 1100)   Barriers/Navigation Needs: Coordination of Care (07/21/16 1100)       Coordination of Care: Appts (Oncotype results) (07/21/16 1100)                  Time Spent with Patient: 30 (07/21/16 1100)   Received call yesterday from patient.  She would like earlier appointment with Dr. Grayland Ormond, because she returns to work next week.  Per [facility] RN, Oncotype results are not back yet.  Patient phoned again  this morning.  Spoke to Snyder.  She checked Oncotype schedule, and results are scheduled to come in on 07/29/16 , which is day of patient's scheduled follow-up with Dr. Grayland Ormond. Explained to patient that if results come in earlier, I will contact her.

## 2016-07-22 ENCOUNTER — Inpatient Hospital Stay: Payer: 59 | Attending: Oncology | Admitting: Oncology

## 2016-07-22 VITALS — BP 146/75 | HR 84 | Temp 99.2°F | Resp 18 | Wt 119.7 lb

## 2016-07-22 DIAGNOSIS — C50411 Malignant neoplasm of upper-outer quadrant of right female breast: Secondary | ICD-10-CM | POA: Insufficient documentation

## 2016-07-22 DIAGNOSIS — F419 Anxiety disorder, unspecified: Secondary | ICD-10-CM | POA: Diagnosis not present

## 2016-07-22 DIAGNOSIS — Z79899 Other long term (current) drug therapy: Secondary | ICD-10-CM | POA: Diagnosis not present

## 2016-07-22 DIAGNOSIS — M5136 Other intervertebral disc degeneration, lumbar region: Secondary | ICD-10-CM | POA: Insufficient documentation

## 2016-07-22 DIAGNOSIS — Z170 Estrogen receptor positive status [ER+]: Secondary | ICD-10-CM

## 2016-07-22 DIAGNOSIS — Z87891 Personal history of nicotine dependence: Secondary | ICD-10-CM | POA: Diagnosis not present

## 2016-07-22 NOTE — Progress Notes (Signed)
Complains of soreness and burning pain to right breast surgical site.

## 2016-07-22 NOTE — Progress Notes (Signed)
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 Oncotype DX 24, intermediate risk.  INTERVAL HISTORY: Patient returns to clinic today for further evaluation and additional treatment planning. She continues to have mild tenderness at her surgical site, but otherwise tolerated her procedure well. She currently feels well and is asymptomatic. She has no neurologic complaints. She denies any recent fevers or illnesses. She has a good appetite and denies weight loss. She has no chest pain or shortness of breath. She denies any nausea, vomiting, constipation, or diarrhea. She has no urinary complaints. Patient offers no further specific complaints.  REVIEW OF SYSTEMS:   Review of Systems  Constitutional: Negative.  Negative for fever, malaise/fatigue and weight loss.  Respiratory: Negative.  Negative for cough and shortness of breath.   Cardiovascular: Negative.  Negative for chest pain and leg swelling.  Gastrointestinal: Negative.  Negative for abdominal pain.  Genitourinary: Negative.   Musculoskeletal: Negative.   Neurological: Negative.   Psychiatric/Behavioral: The patient is nervous/anxious.     As per HPI. Otherwise, a complete review of systems is negative.  PAST MEDICAL HISTORY: Past Medical History:  Diagnosis Date  . Anemia   . Anxiety   . Arthritis    LOWER BACK  . Breast cancer ([place]) 06/2016   right breast INVASIVE MAMMARY CARCINOMA  . Cervical dysplasia   . DDD (degenerative disc disease), lumbar     PAST SURGICAL HISTORY: Past Surgical History:  Procedure Laterality Date  . BREAST BIOPSY Right 06/11/2016   INVASIVE MAMMARY CARCINOMA  . BREAST LUMPECTOMY  Right 07/08/2016   INVASIVE MAMMARY CARCINOMA  . CERVIX SURGERY    . PARTIAL MASTECTOMY WITH NEEDLE LOCALIZATION Right 07/08/2016   Procedure: PARTIAL MASTECTOMY WITH NEEDLE LOCALIZATION;  Surgeon: Leonie Green, MD;  Location: [facility] ORS;  Service: General;  Laterality: Right;  . SENTINEL NODE BIOPSY Right 07/08/2016   Procedure: SENTINEL NODE BIOPSY;  Surgeon: Leonie Green, MD;  Location: [facility] ORS;  Service: General;  Laterality: Right;  . WISDOM TOOTH EXTRACTION     AGE 56 OR 11    FAMILY HISTORY: Family History  Problem Relation Age of Onset  . Breast cancer Neg Hx     ADVANCED DIRECTIVES (Y/N):  N  HEALTH MAINTENANCE: Social History  Substance Use Topics  . Smoking status: Former Smoker    Years: 2.00    Types: Cigarettes    Quit date: 07/01/1996  . Smokeless tobacco: Never Used     Comment: ONLY WEEKEND SMOKER  . Alcohol use No     Colonoscopy:  PAP:  Bone density:  Lipid panel:  No Known Allergies  Current Outpatient Prescriptions  Medication Sig Dispense Refill  . ALPRAZolam (XANAX) 0.25 MG tablet Take 1 tablet by mouth daily as needed.    Marland Kitchen HYDROcodone-acetaminophen (NORCO) 10-325 MG tablet Take 1 tablet by mouth daily as needed.    . Iron-Vitamin C (VITRON-C) 65-125 MG TABS Take 1 tablet by mouth daily.    . traMADol (ULTRAM) 50 MG tablet Take 50 mg by mouth every 6 (six) hours as needed.    . zolpidem (AMBIEN) 10 MG tablet Take 1 tablet by mouth at bedtime.     No current facility-administered medications for this  visit.     OBJECTIVE: Vitals:   07/22/16 1501  BP: (!) 146/75  Pulse: 84  Resp: 18  Temp: 99.2 F (37.3 C)     Body mass index is 20.55 kg/m.    ECOG FS:0 - Asymptomatic  General: Well-developed, well-nourished, no acute distress. Eyes: Pink conjunctiva, anicteric sclera. HEENT: Normocephalic, moist mucous membranes, clear oropharnyx. Breasts: Right breast with well-healing surgical scar. Axilla without lymphadenopathy. Lungs:  Clear to auscultation bilaterally. Heart: Regular rate and rhythm. No rubs, murmurs, or gallops. Abdomen: Soft, nontender, nondistended. No organomegaly noted, normoactive bowel sounds. Musculoskeletal: No edema, cyanosis, or clubbing. Neuro: Alert, answering all questions appropriately. Cranial nerves grossly intact. Skin: No rashes or petechiae noted. Psych: Normal affect.   LAB RESULTS:  No results found for: NA, K, CL, CO2, GLUCOSE, BUN, CREATININE, CALCIUM, PROT, ALBUMIN, AST, ALT, ALKPHOS, BILITOT, GFRNONAA, GFRAA  No results found for: WBC, NEUTROABS, HGB, HCT, MCV, PLT   STUDIES: Nm Sentinel Node Injection  Result Date: 07/08/2016 CLINICAL DATA:  Right breast cancer. EXAM: NUCLEAR MEDICINE BREAST LYMPHOSCINTIGRAPHY TECHNIQUE: Intradermal injection of radiopharmaceutical was performed at the 12 o'clock, 3 o'clock, 6 o'clock, and 9 o'clock positions around the right nipple. The patient was then sent to the operating room where the sentinel node(s) were identified and removed by the surgeon. RADIOPHARMACEUTICALS:  Total of 1 mCi Millipore-filtered Technetium-8msulfur colloid, injected in four aliquots of 0.25 mCi each. IMPRESSION: Uncomplicated intradermal injection of a total of 1 mCi Technetium-966mulfur colloid for purposes of sentinel node identification. Electronically Signed   By: GlAletta Edouard.D.   On: 07/08/2016 16:12   Mm Breast Surgical Specimen  Result Date: 07/08/2016 CLINICAL DATA:  Status post right lumpectomy EXAM: SPECIMEN RADIOGRAPH OF THE RIGHT BREAST COMPARISON:  Previous exam(s). FINDINGS: Status post excision of the right breast. The wire tip and biopsy marker clip are present and are marked for pathology. Findings were communicated to Dr. SmTamala Juliann the operating room via telephone at the time of image acquisition. IMPRESSION: Specimen radiograph of the right breast. Electronically Signed   By: ErPamelia Hoit.D.   On: 07/08/2016 13:37   Mm Rt Plc Breast Loc Dev    1st Lesion  Inc Mammo Guide  Result Date: 07/08/2016 CLINICAL DATA:  [age]ear old female for wire localization of a right breast biopsy site demonstrating carcinoma. EXAM: NEEDLE LOCALIZATION OF THE RIGHT BREAST WITH MAMMO GUIDANCE COMPARISON:  Previous exams. FINDINGS: Patient presents for needle localization prior to right lumpectomy. I met with the patient and we discussed the procedure of needle localization including benefits and alternatives. We discussed the high likelihood of a successful procedure. We discussed the risks of the procedure, including infection, bleeding, tissue injury, and further surgery. Informed, written consent was given. The usual time-out protocol was performed immediately prior to the procedure. Using mammographic guidance, sterile technique, 1% lidocaine and a 7 cm modified Kopans needle, the coil shaped biopsy marker in the upper, outer right breast was localized using a lateral to medial approach. The images were marked for Dr. SmTamala JulianIMPRESSION: Needle localization of the right breast. No apparent complications. Electronically Signed   By: ErPamelia Hoit.D.   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 Oncotype DX 24, intermediate risk.  PLAN:    1.  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  Oncotype DX 24, intermediate risk: Although patient has an intermediate risk Oncotype, she has declined adjuvant chemotherapy. Given the small size of her tumor, it was agreed upon that is likely not necessary. Patient will proceed with adjuvant XRT in the next several weeks. When she completes her XRT, she will require an aromatase inhibitor for total 5 years. Return to clinic at the end of June for further evaluation and to initiate an aromatase inhibitor.   Patient expressed understanding  and was in agreement with this plan. She also understands that She can call clinic at any time with any questions, concerns, or complaints.   Approximately 30 minutes was spent in discussion of which greater than 50% was consultation.  Cancer Staging Primary cancer of upper outer quadrant of right female breast [facility]) Staging form: Breast, AJCC 8th Edition - Clinical stage from 06/15/2016: Stage IA (cT1b, cN0, cM0, G2, ER: Positive, PR: Positive, HER2: Negative) - Signed by Lloyd Huger, MD on 07/24/2016 - Pathologic stage from 07/24/2016: Stage IA (pT1b, pN0, cM0, G2, ER: Positive, PR: Positive, HER2: Negative, Oncotype DX score: 24) - Signed by Lloyd Huger, MD on 07/24/2016   Lloyd Huger, MD   07/24/2016 10:02 AM

## 2016-07-23 ENCOUNTER — Encounter: Payer: Self-pay | Admitting: *Deleted

## 2016-07-28 ENCOUNTER — Encounter: Payer: Self-pay | Admitting: Radiation Oncology

## 2016-07-28 ENCOUNTER — Ambulatory Visit
Admission: RE | Admit: 2016-07-28 | Discharge: 2016-07-28 | Disposition: A | Discharge status: Home / Self Care | Payer: 59 | Source: Ambulatory Visit | Attending: Radiation Oncology | Admitting: Radiation Oncology

## 2016-07-28 VITALS — BP 131/69 | HR 81 | Temp 99.4°F | Resp 18 | Wt 122.1 lb

## 2016-07-28 DIAGNOSIS — Z510 Encounter for antineoplastic radiation therapy: Secondary | ICD-10-CM | POA: Insufficient documentation

## 2016-07-28 DIAGNOSIS — Z170 Estrogen receptor positive status [ER+]: Secondary | ICD-10-CM | POA: Insufficient documentation

## 2016-07-28 DIAGNOSIS — Z9011 Acquired absence of right breast and nipple: Secondary | ICD-10-CM | POA: Insufficient documentation

## 2016-07-28 DIAGNOSIS — N879 Dysplasia of cervix uteri, unspecified: Secondary | ICD-10-CM | POA: Diagnosis not present

## 2016-07-28 DIAGNOSIS — Z79899 Other long term (current) drug therapy: Secondary | ICD-10-CM | POA: Diagnosis not present

## 2016-07-28 DIAGNOSIS — C50411 Malignant neoplasm of upper-outer quadrant of right female breast: Secondary | ICD-10-CM | POA: Insufficient documentation

## 2016-07-28 DIAGNOSIS — D649 Anemia, unspecified: Secondary | ICD-10-CM | POA: Diagnosis not present

## 2016-07-28 DIAGNOSIS — Z87891 Personal history of nicotine dependence: Secondary | ICD-10-CM | POA: Diagnosis not present

## 2016-07-28 DIAGNOSIS — M129 Arthropathy, unspecified: Secondary | ICD-10-CM | POA: Insufficient documentation

## 2016-07-28 DIAGNOSIS — M5136 Other intervertebral disc degeneration, lumbar region: Secondary | ICD-10-CM | POA: Insufficient documentation

## 2016-07-28 DIAGNOSIS — F419 Anxiety disorder, unspecified: Secondary | ICD-10-CM | POA: Insufficient documentation

## 2016-07-28 NOTE — Consult Note (Signed)
NEW PATIENT EVALUATION  Name: Erin Stevens  MRN: [id number]  Date:   07/28/2016     DOB: 03-18-1961   This 56 y.o. female patient presents to the clinic for initial evaluation of stage I a ER/PR positive HER-2/neu negative invasive carcinoma the upper outer quadrant the right breast..  REFERRING PHYSICIAN: [name], MD  CHIEF COMPLAINT:  Chief Complaint  Patient presents with  . Breast Cancer    Pt is here for initial consultation of breast cancer.      DIAGNOSIS: The encounter diagnosis was Malignant neoplasm of upper-outer quadrant of right breast in female, estrogen receptor positive ([place]).   PREVIOUS INVESTIGATIONS:  Pathology reports reviewed Mammograms and ultrasound reviewed Clinical notes reviewed  HPI: Patient is a 56 year old female who presented with an abnormal mammogram showing 8 mm irregular mass in the upper outer quadrant of the right breast. This was confirmed on ultrasound. Ultrasound guided biopsy was performed showing ER/PR positive HER-2/neu negative invasive mammary carcinoma. She underwent a wide local excision for a 0.8 mm grade 2 invasive mammary carcinoma. One sentinel lymph node was negative for metastatic disease. Margins were clear at 6 mm. Patient underwent Oncotype DX showing intermediate risk of recurrence although based on the small size of the lesion both her and medical oncologist decided not to pursue systemic chemotherapy. She is seen today for radiation oncology opinion she is doing well still somewhat sore has returned to work. She specifically denies cough or bone pain.  PLANNED TREATMENT REGIMEN: Hypofractionated whole breast radiation  PAST MEDICAL HISTORY:  has a past medical history of Anemia; Anxiety; Arthritis; Breast cancer ([place]) (06/2016); Cervical dysplasia; and DDD (degenerative disc disease), lumbar.    PAST SURGICAL HISTORY:  Past Surgical History:  Procedure Laterality Date  . BREAST BIOPSY Right 06/11/2016   INVASIVE  MAMMARY CARCINOMA  . BREAST LUMPECTOMY Right 07/08/2016   INVASIVE MAMMARY CARCINOMA  . CERVIX SURGERY    . PARTIAL MASTECTOMY WITH NEEDLE LOCALIZATION Right 07/08/2016   Procedure: PARTIAL MASTECTOMY WITH NEEDLE LOCALIZATION;  Surgeon: Leonie Green, MD;  Location: [facility] ORS;  Service: General;  Laterality: Right;  . SENTINEL NODE BIOPSY Right 07/08/2016   Procedure: SENTINEL NODE BIOPSY;  Surgeon: Leonie Green, MD;  Location: [facility] ORS;  Service: General;  Laterality: Right;  . WISDOM TOOTH EXTRACTION     AGE 30 OR 11    FAMILY HISTORY: family history is not on file.  SOCIAL HISTORY:  reports that she quit smoking about 20 years ago. Her smoking use included Cigarettes. She quit after 2.00 years of use. She has never used smokeless tobacco. She reports that she does not drink alcohol or use drugs.  ALLERGIES: Patient has no known allergies.  MEDICATIONS:  Current Outpatient Prescriptions  Medication Sig Dispense Refill  . ALPRAZolam (XANAX) 0.25 MG tablet Take 1 tablet by mouth daily as needed.    Marland Kitchen HYDROcodone-acetaminophen (NORCO) 10-325 MG tablet Take 1 tablet by mouth daily as needed.    . Iron-Vitamin C (VITRON-C) 65-125 MG TABS Take 1 tablet by mouth daily.    . traMADol (ULTRAM) 50 MG tablet Take 50 mg by mouth every 6 (six) hours as needed.    . zolpidem (AMBIEN) 10 MG tablet Take 1 tablet by mouth at bedtime.     No current facility-administered medications for this encounter.     ECOG PERFORMANCE STATUS:  0 - Asymptomatic  REVIEW OF SYSTEMS:  Patient denies any weight loss, fatigue, weakness, fever, chills or night sweats.  Patient denies any loss of vision, blurred vision. Patient denies any ringing  of the ears or hearing loss. No irregular heartbeat. Patient denies heart murmur or history of fainting. Patient denies any chest pain or pain radiating to her upper extremities. Patient denies any shortness of breath, difficulty breathing at night, cough or  hemoptysis. Patient denies any swelling in the lower legs. Patient denies any nausea vomiting, vomiting of blood, or coffee ground material in the vomitus. Patient denies any stomach pain. Patient states has had normal bowel movements no significant constipation or diarrhea. Patient denies any dysuria, hematuria or significant nocturia. Patient denies any problems walking, swelling in the joints or loss of balance. Patient denies any skin changes, loss of hair or loss of weight. Patient denies any excessive worrying or anxiety or significant depression. Patient denies any problems with insomnia. Patient denies excessive thirst, polyuria, polydipsia. Patient denies any swollen glands, patient denies easy bruising or easy bleeding. Patient denies any recent infections, allergies or URI. Patient "s visual fields have not changed significantly in recent time.    PHYSICAL EXAM: BP 131/69   Pulse 81   Temp 99.4 F (37.4 C)   Resp 18   Wt 122 lb 2.2 oz (55.4 kg)   BMI 20.96 kg/m  Right breast is wide local excision scar which is healing well no dominant mass or nodularity is noted in either breast in 2 positions examined. Breasts are small would be an excellent candidate for hypofractionated course of treatment. No evidence of axillary or supra clavicular adenopathy is identified. Well-developed well-nourished patient in NAD. HEENT reveals PERLA, EOMI, discs not visualized.  Oral cavity is clear. No oral mucosal lesions are identified. Neck is clear without evidence of cervical or supraclavicular adenopathy. Lungs are clear to A&P. Cardiac examination is essentially unremarkable with regular rate and rhythm without murmur rub or thrill. Abdomen is benign with no organomegaly or masses noted. Motor sensory and DTR levels are equal and symmetric in the upper and lower extremities. Cranial nerves II through XII are grossly intact. Proprioception is intact. No peripheral adenopathy or edema is identified. No  motor or sensory levels are noted. Crude visual fields are within normal range.  LABORATORY DATA: Pathology reports reviewed    RADIOLOGY RESULTS: Mammograms and ultrasound reviewed   IMPRESSION: Stage I invasive mammary carcinoma ER/PR positive of the right breast as was wide local excision in 56 year old female to forego systemic chemotherapy  PLAN: At this time I have recommended hypofractionated fractionated course of radiation therapy to her right breast. Will treat her whole breast over 4 weeks and boost her scar another 1400 cGy using electron beam. Risks and benefits of treatment including skin reaction fatigue alteration of blood counts possible inclusion of superficial lung all were discussed in detail with the patient. Patient also will be candidate for antiestrogen therapy after completion of radiation. I've personally set up and ordered CT simulation for next week. Patient seems to comprehend my treatment plan well.  I would like to take this opportunity to thank you for allowing me to participate in the care of your patient.Armstead Peaks., MD

## 2016-07-29 ENCOUNTER — Ambulatory Visit: Payer: 59 | Admitting: Oncology

## 2016-08-04 ENCOUNTER — Ambulatory Visit: Payer: 59

## 2016-08-04 ENCOUNTER — Ambulatory Visit
Admission: RE | Admit: 2016-08-04 | Discharge: 2016-08-04 | Disposition: A | Discharge status: Home / Self Care | Payer: 59 | Source: Ambulatory Visit | Attending: Radiation Oncology | Admitting: Radiation Oncology

## 2016-08-04 DIAGNOSIS — C50411 Malignant neoplasm of upper-outer quadrant of right female breast: Secondary | ICD-10-CM | POA: Diagnosis not present

## 2016-08-06 ENCOUNTER — Other Ambulatory Visit: Payer: Self-pay | Admitting: *Deleted

## 2016-08-06 DIAGNOSIS — C50411 Malignant neoplasm of upper-outer quadrant of right female breast: Secondary | ICD-10-CM

## 2016-08-09 DIAGNOSIS — C50411 Malignant neoplasm of upper-outer quadrant of right female breast: Secondary | ICD-10-CM | POA: Diagnosis not present

## 2016-08-12 ENCOUNTER — Ambulatory Visit
Admission: RE | Admit: 2016-08-12 | Discharge: 2016-08-12 | Disposition: A | Discharge status: Home / Self Care | Payer: 59 | Source: Ambulatory Visit | Attending: Radiation Oncology | Admitting: Radiation Oncology

## 2016-08-12 DIAGNOSIS — C50411 Malignant neoplasm of upper-outer quadrant of right female breast: Secondary | ICD-10-CM | POA: Diagnosis not present

## 2016-08-16 ENCOUNTER — Ambulatory Visit
Admission: RE | Admit: 2016-08-16 | Discharge: 2016-08-16 | Disposition: A | Discharge status: Home / Self Care | Payer: 59 | Source: Ambulatory Visit | Attending: Radiation Oncology | Admitting: Radiation Oncology

## 2016-08-16 DIAGNOSIS — C50411 Malignant neoplasm of upper-outer quadrant of right female breast: Secondary | ICD-10-CM | POA: Diagnosis not present

## 2016-08-17 ENCOUNTER — Ambulatory Visit
Admission: RE | Admit: 2016-08-17 | Discharge: 2016-08-17 | Disposition: A | Discharge status: Home / Self Care | Payer: 59 | Source: Ambulatory Visit | Attending: Radiation Oncology | Admitting: Radiation Oncology

## 2016-08-17 DIAGNOSIS — C50411 Malignant neoplasm of upper-outer quadrant of right female breast: Secondary | ICD-10-CM | POA: Diagnosis not present

## 2016-08-18 ENCOUNTER — Ambulatory Visit
Admission: RE | Admit: 2016-08-18 | Discharge: 2016-08-18 | Disposition: A | Discharge status: Home / Self Care | Payer: 59 | Source: Ambulatory Visit | Attending: Radiation Oncology | Admitting: Radiation Oncology

## 2016-08-18 DIAGNOSIS — C50411 Malignant neoplasm of upper-outer quadrant of right female breast: Secondary | ICD-10-CM | POA: Diagnosis not present

## 2016-08-19 ENCOUNTER — Ambulatory Visit
Admission: RE | Admit: 2016-08-19 | Discharge: 2016-08-19 | Disposition: A | Discharge status: Home / Self Care | Payer: 59 | Source: Ambulatory Visit | Attending: Radiation Oncology | Admitting: Radiation Oncology

## 2016-08-19 DIAGNOSIS — C50411 Malignant neoplasm of upper-outer quadrant of right female breast: Secondary | ICD-10-CM | POA: Diagnosis not present

## 2016-08-20 ENCOUNTER — Ambulatory Visit
Admission: RE | Admit: 2016-08-20 | Discharge: 2016-08-20 | Disposition: A | Discharge status: Home / Self Care | Payer: 59 | Source: Ambulatory Visit | Attending: Radiation Oncology | Admitting: Radiation Oncology

## 2016-08-20 ENCOUNTER — Encounter: Payer: Self-pay | Admitting: *Deleted

## 2016-08-20 DIAGNOSIS — C50411 Malignant neoplasm of upper-outer quadrant of right female breast: Secondary | ICD-10-CM | POA: Diagnosis not present

## 2016-08-23 ENCOUNTER — Ambulatory Visit: Payer: 59

## 2016-08-24 ENCOUNTER — Ambulatory Visit
Admission: RE | Admit: 2016-08-24 | Discharge: 2016-08-24 | Disposition: A | Discharge status: Home / Self Care | Payer: 59 | Source: Ambulatory Visit | Attending: Radiation Oncology | Admitting: Radiation Oncology

## 2016-08-24 DIAGNOSIS — C50411 Malignant neoplasm of upper-outer quadrant of right female breast: Secondary | ICD-10-CM | POA: Diagnosis not present

## 2016-08-25 ENCOUNTER — Ambulatory Visit
Admission: RE | Admit: 2016-08-25 | Discharge: 2016-08-25 | Disposition: A | Discharge status: Home / Self Care | Payer: 59 | Source: Ambulatory Visit | Attending: Radiation Oncology | Admitting: Radiation Oncology

## 2016-08-25 DIAGNOSIS — C50411 Malignant neoplasm of upper-outer quadrant of right female breast: Secondary | ICD-10-CM | POA: Diagnosis not present

## 2016-08-26 ENCOUNTER — Inpatient Hospital Stay: Payer: 59 | Attending: Radiation Oncology

## 2016-08-26 ENCOUNTER — Ambulatory Visit
Admission: RE | Admit: 2016-08-26 | Discharge: 2016-08-26 | Disposition: A | Discharge status: Home / Self Care | Payer: 59 | Source: Ambulatory Visit | Attending: Radiation Oncology | Admitting: Radiation Oncology

## 2016-08-26 DIAGNOSIS — Z87891 Personal history of nicotine dependence: Secondary | ICD-10-CM | POA: Insufficient documentation

## 2016-08-26 DIAGNOSIS — C50411 Malignant neoplasm of upper-outer quadrant of right female breast: Secondary | ICD-10-CM | POA: Diagnosis not present

## 2016-08-26 DIAGNOSIS — M5136 Other intervertebral disc degeneration, lumbar region: Secondary | ICD-10-CM | POA: Diagnosis not present

## 2016-08-26 DIAGNOSIS — Z170 Estrogen receptor positive status [ER+]: Secondary | ICD-10-CM | POA: Insufficient documentation

## 2016-08-26 DIAGNOSIS — F419 Anxiety disorder, unspecified: Secondary | ICD-10-CM | POA: Diagnosis not present

## 2016-08-26 DIAGNOSIS — Z79899 Other long term (current) drug therapy: Secondary | ICD-10-CM | POA: Insufficient documentation

## 2016-08-26 LAB — CBC
HCT: 31.3 % — ABNORMAL LOW (ref 35.0–47.0)
Hemoglobin: 10.1 g/dL — ABNORMAL LOW (ref 12.0–16.0)
MCH: 21.3 pg — AB (ref 26.0–34.0)
MCHC: 32.2 g/dL (ref 32.0–36.0)
MCV: 66.1 fL — ABNORMAL LOW (ref 80.0–100.0)
PLATELETS: 202 10*3/uL (ref 150–440)
RBC: 4.74 MIL/uL (ref 3.80–5.20)
RDW: 15.2 % — ABNORMAL HIGH (ref 11.5–14.5)
WBC: 6.3 10*3/uL (ref 3.6–11.0)

## 2016-08-27 ENCOUNTER — Ambulatory Visit
Admission: RE | Admit: 2016-08-27 | Discharge: 2016-08-27 | Disposition: A | Discharge status: Home / Self Care | Payer: 59 | Source: Ambulatory Visit | Attending: Radiation Oncology | Admitting: Radiation Oncology

## 2016-08-27 DIAGNOSIS — C50411 Malignant neoplasm of upper-outer quadrant of right female breast: Secondary | ICD-10-CM | POA: Diagnosis not present

## 2016-08-31 ENCOUNTER — Ambulatory Visit
Admission: RE | Admit: 2016-08-31 | Discharge: 2016-08-31 | Disposition: A | Discharge status: Home / Self Care | Payer: 59 | Source: Ambulatory Visit | Attending: Radiation Oncology | Admitting: Radiation Oncology

## 2016-08-31 ENCOUNTER — Encounter: Payer: Self-pay | Admitting: *Deleted

## 2016-08-31 DIAGNOSIS — C50411 Malignant neoplasm of upper-outer quadrant of right female breast: Secondary | ICD-10-CM | POA: Diagnosis not present

## 2016-09-01 ENCOUNTER — Ambulatory Visit
Admission: RE | Admit: 2016-09-01 | Discharge: 2016-09-01 | Disposition: A | Discharge status: Home / Self Care | Payer: 59 | Source: Ambulatory Visit | Attending: Radiation Oncology | Admitting: Radiation Oncology

## 2016-09-01 DIAGNOSIS — C50411 Malignant neoplasm of upper-outer quadrant of right female breast: Secondary | ICD-10-CM | POA: Diagnosis not present

## 2016-09-02 ENCOUNTER — Ambulatory Visit
Admission: RE | Admit: 2016-09-02 | Discharge: 2016-09-02 | Disposition: A | Discharge status: Home / Self Care | Payer: 59 | Source: Ambulatory Visit | Attending: Radiation Oncology | Admitting: Radiation Oncology

## 2016-09-02 DIAGNOSIS — C50411 Malignant neoplasm of upper-outer quadrant of right female breast: Secondary | ICD-10-CM | POA: Diagnosis not present

## 2016-09-03 ENCOUNTER — Ambulatory Visit
Admission: RE | Admit: 2016-09-03 | Discharge: 2016-09-03 | Disposition: A | Discharge status: Home / Self Care | Payer: 59 | Source: Ambulatory Visit | Attending: Radiation Oncology | Admitting: Radiation Oncology

## 2016-09-03 DIAGNOSIS — C50411 Malignant neoplasm of upper-outer quadrant of right female breast: Secondary | ICD-10-CM | POA: Diagnosis not present

## 2016-09-06 ENCOUNTER — Ambulatory Visit
Admission: RE | Admit: 2016-09-06 | Discharge: 2016-09-06 | Disposition: A | Discharge status: Home / Self Care | Payer: 59 | Source: Ambulatory Visit | Attending: Radiation Oncology | Admitting: Radiation Oncology

## 2016-09-06 DIAGNOSIS — C50411 Malignant neoplasm of upper-outer quadrant of right female breast: Secondary | ICD-10-CM | POA: Diagnosis not present

## 2016-09-07 ENCOUNTER — Ambulatory Visit
Admission: RE | Admit: 2016-09-07 | Discharge: 2016-09-07 | Disposition: A | Discharge status: Home / Self Care | Payer: 59 | Source: Ambulatory Visit | Attending: Radiation Oncology | Admitting: Radiation Oncology

## 2016-09-07 DIAGNOSIS — C50411 Malignant neoplasm of upper-outer quadrant of right female breast: Secondary | ICD-10-CM | POA: Diagnosis not present

## 2016-09-08 ENCOUNTER — Ambulatory Visit: Payer: 59

## 2016-09-08 ENCOUNTER — Ambulatory Visit
Admission: RE | Admit: 2016-09-08 | Discharge: 2016-09-08 | Disposition: A | Discharge status: Home / Self Care | Payer: 59 | Source: Ambulatory Visit | Attending: Radiation Oncology | Admitting: Radiation Oncology

## 2016-09-08 DIAGNOSIS — C50411 Malignant neoplasm of upper-outer quadrant of right female breast: Secondary | ICD-10-CM | POA: Diagnosis not present

## 2016-09-09 ENCOUNTER — Ambulatory Visit
Admission: RE | Admit: 2016-09-09 | Discharge: 2016-09-09 | Disposition: A | Discharge status: Home / Self Care | Payer: 59 | Source: Ambulatory Visit | Attending: Radiation Oncology | Admitting: Radiation Oncology

## 2016-09-09 ENCOUNTER — Inpatient Hospital Stay: Payer: 59 | Attending: Radiation Oncology

## 2016-09-09 DIAGNOSIS — C50411 Malignant neoplasm of upper-outer quadrant of right female breast: Secondary | ICD-10-CM

## 2016-09-09 DIAGNOSIS — Z87891 Personal history of nicotine dependence: Secondary | ICD-10-CM | POA: Diagnosis not present

## 2016-09-09 DIAGNOSIS — Z79899 Other long term (current) drug therapy: Secondary | ICD-10-CM | POA: Diagnosis not present

## 2016-09-09 DIAGNOSIS — F419 Anxiety disorder, unspecified: Secondary | ICD-10-CM | POA: Insufficient documentation

## 2016-09-09 DIAGNOSIS — Z170 Estrogen receptor positive status [ER+]: Secondary | ICD-10-CM | POA: Insufficient documentation

## 2016-09-09 DIAGNOSIS — M5136 Other intervertebral disc degeneration, lumbar region: Secondary | ICD-10-CM | POA: Diagnosis not present

## 2016-09-09 LAB — CBC
HEMATOCRIT: 28.8 % — AB (ref 35.0–47.0)
HEMOGLOBIN: 9.3 g/dL — AB (ref 12.0–16.0)
MCH: 21.5 pg — AB (ref 26.0–34.0)
MCHC: 32.4 g/dL (ref 32.0–36.0)
MCV: 66.4 fL — AB (ref 80.0–100.0)
Platelets: 184 10*3/uL (ref 150–440)
RBC: 4.34 MIL/uL (ref 3.80–5.20)
RDW: 14.6 % — ABNORMAL HIGH (ref 11.5–14.5)
WBC: 6 10*3/uL (ref 3.6–11.0)

## 2016-09-10 ENCOUNTER — Ambulatory Visit
Admission: RE | Admit: 2016-09-10 | Discharge: 2016-09-10 | Disposition: A | Discharge status: Home / Self Care | Payer: 59 | Source: Ambulatory Visit | Attending: Radiation Oncology | Admitting: Radiation Oncology

## 2016-09-10 DIAGNOSIS — C50411 Malignant neoplasm of upper-outer quadrant of right female breast: Secondary | ICD-10-CM | POA: Diagnosis not present

## 2016-09-13 ENCOUNTER — Ambulatory Visit
Admission: RE | Admit: 2016-09-13 | Discharge: 2016-09-13 | Disposition: A | Discharge status: Home / Self Care | Payer: 59 | Source: Ambulatory Visit | Attending: Radiation Oncology | Admitting: Radiation Oncology

## 2016-09-13 DIAGNOSIS — C50411 Malignant neoplasm of upper-outer quadrant of right female breast: Secondary | ICD-10-CM | POA: Diagnosis not present

## 2016-09-14 ENCOUNTER — Ambulatory Visit
Admission: RE | Admit: 2016-09-14 | Discharge: 2016-09-14 | Disposition: A | Discharge status: Home / Self Care | Payer: 59 | Source: Ambulatory Visit | Attending: Radiation Oncology | Admitting: Radiation Oncology

## 2016-09-14 DIAGNOSIS — C50411 Malignant neoplasm of upper-outer quadrant of right female breast: Secondary | ICD-10-CM | POA: Diagnosis not present

## 2016-09-15 ENCOUNTER — Ambulatory Visit
Admission: RE | Admit: 2016-09-15 | Discharge: 2016-09-15 | Disposition: A | Discharge status: Home / Self Care | Payer: 59 | Source: Ambulatory Visit | Attending: Radiation Oncology | Admitting: Radiation Oncology

## 2016-09-15 DIAGNOSIS — C50411 Malignant neoplasm of upper-outer quadrant of right female breast: Secondary | ICD-10-CM | POA: Diagnosis not present

## 2016-09-16 ENCOUNTER — Ambulatory Visit
Admission: RE | Admit: 2016-09-16 | Discharge: 2016-09-16 | Disposition: A | Discharge status: Home / Self Care | Payer: 59 | Source: Ambulatory Visit | Attending: Radiation Oncology | Admitting: Radiation Oncology

## 2016-09-16 DIAGNOSIS — C50411 Malignant neoplasm of upper-outer quadrant of right female breast: Secondary | ICD-10-CM | POA: Diagnosis not present

## 2016-09-17 ENCOUNTER — Ambulatory Visit: Payer: 59

## 2016-09-17 ENCOUNTER — Ambulatory Visit
Admission: RE | Admit: 2016-09-17 | Discharge: 2016-09-17 | Disposition: A | Discharge status: Home / Self Care | Payer: 59 | Source: Ambulatory Visit | Attending: Radiation Oncology | Admitting: Radiation Oncology

## 2016-09-17 DIAGNOSIS — C50411 Malignant neoplasm of upper-outer quadrant of right female breast: Secondary | ICD-10-CM | POA: Diagnosis not present

## 2016-09-20 ENCOUNTER — Encounter: Payer: Self-pay | Admitting: Oncology

## 2016-09-20 ENCOUNTER — Ambulatory Visit
Admission: RE | Admit: 2016-09-20 | Discharge: 2016-09-20 | Disposition: A | Discharge status: Home / Self Care | Payer: 59 | Source: Ambulatory Visit | Attending: Radiation Oncology | Admitting: Radiation Oncology

## 2016-09-20 DIAGNOSIS — C50411 Malignant neoplasm of upper-outer quadrant of right female breast: Secondary | ICD-10-CM | POA: Diagnosis not present

## 2016-09-30 ENCOUNTER — Inpatient Hospital Stay: Payer: 59 | Admitting: Oncology

## 2016-10-04 NOTE — Progress Notes (Signed)
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 Oncotype DX 24, intermediate risk.  INTERVAL HISTORY: Patient returns to clinic today for further evaluation and initiation of an aromatase inhibitor. Patient completed her XRT several weeks ago and tolerated it well without significant side effects. She is anxious, but otherwise feels well and is asymptomatic. She has no neurologic complaints. She denies any recent fevers or illnesses. She has a good appetite and denies weight loss. She has no chest pain or shortness of breath. She denies any nausea, vomiting, constipation, or diarrhea. She has no urinary complaints. Patient offers no specific complaints today.  REVIEW OF SYSTEMS:   Review of Systems  Constitutional: Negative.  Negative for fever, malaise/fatigue and weight loss.  Respiratory: Negative.  Negative for cough and shortness of breath.   Cardiovascular: Negative.  Negative for chest pain and leg swelling.  Gastrointestinal: Negative.  Negative for abdominal pain.  Genitourinary: Negative.   Musculoskeletal: Negative.   Neurological: Negative.   Psychiatric/Behavioral: The patient is nervous/anxious.     As per HPI. Otherwise, a complete review of systems is negative.  PAST MEDICAL HISTORY: Past Medical History:  Diagnosis Date  . Anemia   . Anxiety   . Arthritis    LOWER BACK  . Breast cancer ([place]) 06/2016   right breast INVASIVE MAMMARY CARCINOMA  . Cervical dysplasia   . DDD (degenerative disc disease), lumbar     PAST SURGICAL HISTORY: Past Surgical History:  Procedure Laterality Date  . BREAST BIOPSY Right 06/11/2016   INVASIVE MAMMARY CARCINOMA  .  BREAST LUMPECTOMY Right 07/08/2016   INVASIVE MAMMARY CARCINOMA  . CERVIX SURGERY    . PARTIAL MASTECTOMY WITH NEEDLE LOCALIZATION Right 07/08/2016   Procedure: PARTIAL MASTECTOMY WITH NEEDLE LOCALIZATION;  Surgeon: Leonie Green, MD;  Location: [facility] ORS;  Service: General;  Laterality: Right;  . SENTINEL NODE BIOPSY Right 07/08/2016   Procedure: SENTINEL NODE BIOPSY;  Surgeon: Leonie Green, MD;  Location: [facility] ORS;  Service: General;  Laterality: Right;  . WISDOM TOOTH EXTRACTION     AGE 36 OR 11    FAMILY HISTORY: Family History  Problem Relation Age of Onset  . Breast cancer Neg Hx     ADVANCED DIRECTIVES (Y/N):  N  HEALTH MAINTENANCE: Social History  Substance Use Topics  . Smoking status: Former Smoker    Years: 2.00    Types: Cigarettes    Quit date: 07/01/1996  . Smokeless tobacco: Never Used     Comment: ONLY WEEKEND SMOKER  . Alcohol use No     Colonoscopy:  PAP:  Bone density:  Lipid panel:  No Known Allergies  Current Outpatient Prescriptions  Medication Sig Dispense Refill  . ALPRAZolam (XANAX) 0.25 MG tablet Take 1 tablet by mouth daily as needed.    Marland Kitchen HYDROcodone-acetaminophen (NORCO) 10-325 MG tablet Take 1 tablet by mouth daily as needed.    . Iron-Vitamin C (VITRON-C) 65-125 MG TABS Take 1 tablet by mouth daily.    . traMADol (ULTRAM) 50 MG tablet Take 50 mg by mouth every 6 (six) hours as needed.    . zolpidem (AMBIEN) 10 MG tablet Take 1 tablet by mouth at bedtime.    Marland Kitchen letrozole [facility])  2.5 MG tablet Take 1 tablet (2.5 mg total) by mouth daily. 30 tablet 11   No current facility-administered medications for this visit.     OBJECTIVE: Vitals:   10/07/16 1158  BP: (!) 155/84  Pulse: 94  Resp: 20  Temp: 97.7 F (36.5 C)     Body mass index is 20.37 kg/m.    ECOG FS:0 - Asymptomatic  General: Well-developed, well-nourished, no acute distress. Eyes: Pink conjunctiva, anicteric sclera. Breasts: Right breast with well-healing  surgical scar. Axilla without lymphadenopathy. Exam deferred today. Lungs: Clear to auscultation bilaterally. Heart: Regular rate and rhythm. No rubs, murmurs, or gallops. Abdomen: Soft, nontender, nondistended. No organomegaly noted, normoactive bowel sounds. Musculoskeletal: No edema, cyanosis, or clubbing. Neuro: Alert, answering all questions appropriately. Cranial nerves grossly intact. Skin: No rashes or petechiae noted. Psych: Normal affect.   LAB RESULTS:  No results found for: NA, K, CL, CO2, GLUCOSE, BUN, CREATININE, CALCIUM, PROT, ALBUMIN, AST, ALT, ALKPHOS, BILITOT, GFRNONAA, GFRAA  Lab Results  Component Value Date   WBC 6.0 09/09/2016   HGB 9.3 (L) 09/09/2016   HCT 28.8 (L) 09/09/2016   MCV 66.4 (L) 09/09/2016   PLT 184 09/09/2016     STUDIES: No results found.  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 Oncotype DX 24, intermediate risk.  PLAN:    1.  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 Oncotype DX 24, intermediate risk: Although patient has an intermediate risk Oncotype, she has declined adjuvant chemotherapy. Given the small size of her tumor, it was agreed upon that is likely not necessary. Patient Has now completed XRT. She was given a prescription for letrozole which she will required to take for 5 years completing in July 2023. We will get a baseline bone mineral density in the next 1-2 weeks. Return to clinic in 3 months for further evaluation.   Patient expressed understanding and was in agreement with this plan. She also understands that She can call clinic at any time with any questions, concerns, or complaints.   Approximately 30 minutes was spent in discussion of which greater than 50% was consultation.  Cancer Staging Primary cancer of upper outer quadrant of right female  breast [facility]) Staging form: Breast, AJCC 8th Edition - Clinical stage from 06/15/2016: Stage IA (cT1b, cN0, cM0, G2, ER: Positive, PR: Positive, HER2: Negative) - Signed by Lloyd Huger, MD on 07/24/2016 - Pathologic stage from 07/24/2016: Stage IA (pT1b, pN0, cM0, G2, ER: Positive, PR: Positive, HER2: Negative, Oncotype DX score: 24) - Signed by Lloyd Huger, MD on 07/24/2016   Lloyd Huger, MD   10/07/2016 1:41 PM

## 2016-10-07 ENCOUNTER — Inpatient Hospital Stay: Payer: 59 | Attending: Oncology | Admitting: Oncology

## 2016-10-07 VITALS — BP 155/84 | HR 94 | Temp 97.7°F | Resp 20 | Wt 118.7 lb

## 2016-10-07 DIAGNOSIS — Z87891 Personal history of nicotine dependence: Secondary | ICD-10-CM | POA: Diagnosis not present

## 2016-10-07 DIAGNOSIS — Z170 Estrogen receptor positive status [ER+]: Secondary | ICD-10-CM

## 2016-10-07 DIAGNOSIS — C50411 Malignant neoplasm of upper-outer quadrant of right female breast: Secondary | ICD-10-CM | POA: Diagnosis not present

## 2016-10-07 DIAGNOSIS — Z923 Personal history of irradiation: Secondary | ICD-10-CM | POA: Insufficient documentation

## 2016-10-07 DIAGNOSIS — M5136 Other intervertebral disc degeneration, lumbar region: Secondary | ICD-10-CM | POA: Insufficient documentation

## 2016-10-07 DIAGNOSIS — F419 Anxiety disorder, unspecified: Secondary | ICD-10-CM | POA: Insufficient documentation

## 2016-10-07 MED ORDER — LETROZOLE 2.5 MG PO TABS: 2.5000 mg | ORAL_TABLET | Freq: Every day | ORAL | 11 refills | Status: DC

## 2016-10-07 NOTE — Progress Notes (Signed)
Patient denies any concerns today.  

## 2016-10-11 ENCOUNTER — Ambulatory Visit: Payer: 59 | Admitting: Oncology

## 2016-10-27 ENCOUNTER — Ambulatory Visit
Admission: RE | Admit: 2016-10-27 | Discharge: 2016-10-27 | Disposition: A | Discharge status: Home / Self Care | Payer: 59 | Source: Ambulatory Visit | Attending: Radiation Oncology | Admitting: Radiation Oncology

## 2016-10-27 ENCOUNTER — Encounter: Payer: Self-pay | Admitting: Radiation Oncology

## 2016-10-27 VITALS — BP 140/54 | HR 70 | Temp 99.0°F | Resp 20 | Wt 121.6 lb

## 2016-10-27 DIAGNOSIS — R079 Chest pain, unspecified: Secondary | ICD-10-CM | POA: Diagnosis not present

## 2016-10-27 DIAGNOSIS — Z170 Estrogen receptor positive status [ER+]: Secondary | ICD-10-CM | POA: Insufficient documentation

## 2016-10-27 DIAGNOSIS — Z79811 Long term (current) use of aromatase inhibitors: Secondary | ICD-10-CM | POA: Diagnosis not present

## 2016-10-27 DIAGNOSIS — C50411 Malignant neoplasm of upper-outer quadrant of right female breast: Secondary | ICD-10-CM | POA: Diagnosis not present

## 2016-10-27 DIAGNOSIS — Z923 Personal history of irradiation: Secondary | ICD-10-CM | POA: Diagnosis not present

## 2016-10-27 NOTE — Progress Notes (Signed)
Radiation Oncology Follow up Note  Name: Erin Stevens   Date:   10/27/2016 MRN:  [id number] DOB: 08/15/1960    This 56 y.o. female presents to the clinic today for one-month follow-up status post whole breast radiation to her right breast. For stage I ER/PR positive HER-2/neu negative invasive mammary carcinoma.  REFERRING PROVIDER: [name], MD  HPI: Patient is a 56 year old female now out 1 month having completed whole breast radiation to her right breast for stage I ER/PR positive HER-2/neu negative invasive mammary carcinoma the upper outer quadrant. Seen today in routine follow she's been having some rib and chest wall pain most likely secondary to scar tissue and postsurgical changes. She does not take any pain medication. She otherwise specifically denies breast tenderness cough or bone pain. She is currently on. Femara tolerate that well without side effect.  COMPLICATIONS OF TREATMENT: none  FOLLOW UP COMPLIANCE: keeps appointments   PHYSICAL EXAM:  BP (!) 140/54   Pulse 70   Temp 99 F (37.2 C)   Resp 20   Wt 121 lb 9.3 oz (55.2 kg)   BMI 20.87 kg/m  Lungs are clear to A&P cardiac examination essentially unremarkable with regular rate and rhythm. No dominant mass or nodularity is noted in either breast in 2 positions examined. Incision is well-healed. No axillary or supraclavicular adenopathy is appreciated. Cosmetic result is excellent. Well-developed well-nourished patient in NAD. HEENT reveals PERLA, EOMI, discs not visualized.  Oral cavity is clear. No oral mucosal lesions are identified. Neck is clear without evidence of cervical or supraclavicular adenopathy. Lungs are clear to A&P. Cardiac examination is essentially unremarkable with regular rate and rhythm without murmur rub or thrill. Abdomen is benign with no organomegaly or masses noted. Motor sensory and DTR levels are equal and symmetric in the upper and lower extremities. Cranial nerves II through XII are  grossly intact. Proprioception is intact. No peripheral adenopathy or edema is identified. No motor or sensory levels are noted. Crude visual fields are within normal range.  RADIOLOGY RESULTS: No current films for review  PLAN: Present time she is doing well I've stressed the Tylenol or Advil for her chest wall pain and a cold, pressure to become problematic. Otherwise I'm please were overall progress. I've asked to see her back in 4-5 months for follow-up. She continues on Femara without side effect. She knows to call sooner or visit her surgeon should her chest wall pain persist.  I would like to take this opportunity to thank you for allowing me to participate in the care of your patient.Armstead Peaks., MD

## 2016-11-23 ENCOUNTER — Ambulatory Visit
Admission: RE | Admit: 2016-11-23 | Discharge: 2016-11-23 | Disposition: A | Discharge status: Home / Self Care | Payer: 59 | Source: Ambulatory Visit | Attending: Oncology | Admitting: Oncology

## 2016-11-23 DIAGNOSIS — Z79899 Other long term (current) drug therapy: Secondary | ICD-10-CM | POA: Insufficient documentation

## 2016-11-23 DIAGNOSIS — M85851 Other specified disorders of bone density and structure, right thigh: Secondary | ICD-10-CM | POA: Diagnosis not present

## 2016-11-23 DIAGNOSIS — C50411 Malignant neoplasm of upper-outer quadrant of right female breast: Secondary | ICD-10-CM | POA: Diagnosis present

## 2017-01-03 NOTE — Progress Notes (Signed)
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 Oncotype DX 24, intermediate risk.  INTERVAL HISTORY: Patient returns to clinic today for further evaluation and to assess her toleration of letrozole. She has multiple complaints including joint pain, hot flashes, myalgias. She has no neurologic complaints. She denies any recent fevers or illnesses. She has a good appetite and denies weight loss. She has no chest pain or shortness of breath. She denies any nausea, vomiting, constipation, or diarrhea. She has no urinary complaints. Patient offers no specific complaints today.  REVIEW OF SYSTEMS:   Review of Systems  Constitutional: Negative.  Negative for fever, malaise/fatigue and weight loss.  Respiratory: Negative.  Negative for cough and shortness of breath.   Cardiovascular: Negative.  Negative for chest pain and leg swelling.  Gastrointestinal: Negative.  Negative for abdominal pain.  Genitourinary: Negative.   Musculoskeletal: Positive for joint pain and myalgias.  Skin: Negative.  Negative for rash.  Neurological: Positive for sensory change.  Psychiatric/Behavioral: The patient is nervous/anxious.     As per HPI. Otherwise, a complete review of systems is negative.  PAST MEDICAL HISTORY: Past Medical History:  Diagnosis Date  . Anemia   . Anxiety   . Arthritis    LOWER BACK  . Breast cancer ([place]) 06/2016   right breast INVASIVE MAMMARY CARCINOMA  . Cervical dysplasia   . DDD (degenerative disc disease), lumbar     PAST SURGICAL HISTORY: Past Surgical History:  Procedure Laterality Date  . BREAST BIOPSY Right 06/11/2016   INVASIVE MAMMARY CARCINOMA  . BREAST  LUMPECTOMY Right 07/08/2016   INVASIVE MAMMARY CARCINOMA  . CERVIX SURGERY    . PARTIAL MASTECTOMY WITH NEEDLE LOCALIZATION Right 07/08/2016   Procedure: PARTIAL MASTECTOMY WITH NEEDLE LOCALIZATION;  Surgeon: Leonie Green, MD;  Location: [facility] ORS;  Service: General;  Laterality: Right;  . SENTINEL NODE BIOPSY Right 07/08/2016   Procedure: SENTINEL NODE BIOPSY;  Surgeon: Leonie Green, MD;  Location: [facility] ORS;  Service: General;  Laterality: Right;  . WISDOM TOOTH EXTRACTION     AGE 17 OR 11    FAMILY HISTORY: Family History  Problem Relation Age of Onset  . Breast cancer Neg Hx     ADVANCED DIRECTIVES (Y/N):  N  HEALTH MAINTENANCE: Social History  Substance Use Topics  . Smoking status: Former Smoker    Years: 2.00    Types: Cigarettes    Quit date: 07/01/1996  . Smokeless tobacco: Never Used     Comment: ONLY WEEKEND SMOKER  . Alcohol use No     Colonoscopy:  PAP:  Bone density:  Lipid panel:  No Known Allergies  Current Outpatient Prescriptions  Medication Sig Dispense Refill  . ALPRAZolam (XANAX) 0.25 MG tablet Take 1 tablet by mouth daily as needed.    Marland Kitchen HYDROcodone-acetaminophen (NORCO) 10-325 MG tablet Take 1 tablet by mouth daily as needed.    Marland Kitchen letrozole (FEMARA) 2.5 MG tablet Take 1 tablet (2.5 mg total) by mouth daily. 30 tablet 11  . traMADol (ULTRAM) 50 MG tablet Take 50 mg by mouth every 6 (six) hours as needed.    . zolpidem (AMBIEN) 10 MG tablet Take 1 tablet by mouth at bedtime.    Marland Kitchen  anastrozole (ARIMIDEX) 1 MG tablet Take 1 tablet (1 mg total) by mouth daily. 30 tablet 11  . Iron-Vitamin C (VITRON-C) 65-125 MG TABS Take 1 tablet by mouth daily.     No current facility-administered medications for this visit.     OBJECTIVE: Vitals:   01/06/17 1531  BP: (!) 145/71  Pulse: 71  Resp: 18  Temp: 98.2 F (36.8 C)     Body mass index is 20.82 kg/m.    ECOG FS:0 - Asymptomatic  General: Well-developed, well-nourished, no acute  distress. Eyes: Pink conjunctiva, anicteric sclera. Breasts: Right breast with well-healing surgical scar. Axilla without lymphadenopathy. Exam deferred today. Lungs: Clear to auscultation bilaterally. Heart: Regular rate and rhythm. No rubs, murmurs, or gallops. Abdomen: Soft, nontender, nondistended. No organomegaly noted, normoactive bowel sounds. Musculoskeletal: No edema, cyanosis, or clubbing. Neuro: Alert, answering all questions appropriately. Cranial nerves grossly intact. Skin: No rashes or petechiae noted. Psych: Normal affect.   LAB RESULTS:  No results found for: NA, K, CL, CO2, GLUCOSE, BUN, CREATININE, CALCIUM, PROT, ALBUMIN, AST, ALT, ALKPHOS, BILITOT, GFRNONAA, GFRAA  Lab Results  Component Value Date   WBC 6.0 09/09/2016   HGB 9.3 (L) 09/09/2016   HCT 28.8 (L) 09/09/2016   MCV 66.4 (L) 09/09/2016   PLT 184 09/09/2016     STUDIES: No results found.  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 Oncotype DX 24, intermediate risk.  PLAN:    1.  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 Oncotype DX 24, intermediate risk: Although patient has an intermediate risk Oncotype, she has declined adjuvant chemotherapy. Given the small size of her tumor, it was agreed upon that is likely not necessary. Patient has now completed XRT. Given her side effects, letrozole has been discontinued and patient was given a prescription for anastrozole which she was instructed to wait approximately 3-4 weeks to take. Given her osteopenia, tamoxifen may be an option as well. She will continue treatment for a total of 5 years July 2023.  2. Osteopenia: Bone mineral density on November 23, 2016 revealed a T score of -2.3. Patient was instructed to initiate calcium and vitamin D. Consider Fosamax in the future.   Patient  expressed understanding and was in agreement with this plan. She also understands that She can call clinic at any time with any questions, concerns, or complaints.   Approximately 30 minutes was spent in discussion of which greater than 50% was consultation.  Cancer Staging Primary cancer of upper outer quadrant of right female breast [facility]) Staging form: Breast, AJCC 8th Edition - Clinical stage from 06/15/2016: Stage IA (cT1b, cN0, cM0, G2, ER: Positive, PR: Positive, HER2: Negative) - Signed by Lloyd Huger, MD on 07/24/2016 - Pathologic stage from 07/24/2016: Stage IA (pT1b, pN0, cM0, G2, ER: Positive, PR: Positive, HER2: Negative, Oncotype DX score: 24) - Signed by Lloyd Huger, MD on 07/24/2016   Lloyd Huger, MD   01/07/2017 4:38 PM

## 2017-01-06 ENCOUNTER — Inpatient Hospital Stay: Payer: 59 | Attending: Oncology | Admitting: Oncology

## 2017-01-06 VITALS — BP 145/71 | HR 71 | Temp 98.2°F | Resp 18 | Wt 121.3 lb

## 2017-01-06 DIAGNOSIS — Z87891 Personal history of nicotine dependence: Secondary | ICD-10-CM | POA: Diagnosis not present

## 2017-01-06 DIAGNOSIS — Z923 Personal history of irradiation: Secondary | ICD-10-CM | POA: Insufficient documentation

## 2017-01-06 DIAGNOSIS — Z170 Estrogen receptor positive status [ER+]: Secondary | ICD-10-CM | POA: Diagnosis not present

## 2017-01-06 DIAGNOSIS — Z79899 Other long term (current) drug therapy: Secondary | ICD-10-CM | POA: Insufficient documentation

## 2017-01-06 DIAGNOSIS — F419 Anxiety disorder, unspecified: Secondary | ICD-10-CM | POA: Insufficient documentation

## 2017-01-06 DIAGNOSIS — M8580 Other specified disorders of bone density and structure, unspecified site: Secondary | ICD-10-CM

## 2017-01-06 DIAGNOSIS — M5136 Other intervertebral disc degeneration, lumbar region: Secondary | ICD-10-CM | POA: Diagnosis not present

## 2017-01-06 DIAGNOSIS — C50411 Malignant neoplasm of upper-outer quadrant of right female breast: Secondary | ICD-10-CM | POA: Diagnosis not present

## 2017-01-06 MED ORDER — ANASTROZOLE 1 MG PO TABS: 1.0000 mg | ORAL_TABLET | Freq: Every day | ORAL | 11 refills | Status: DC

## 2017-01-10 ENCOUNTER — Telehealth: Payer: Self-pay | Admitting: *Deleted

## 2017-01-10 MED ORDER — ALENDRONATE SODIUM 70 MG PO TABS
70.0000 mg | ORAL_TABLET | ORAL | 3 refills | Status: DC
Start: 2017-01-10 — End: 2017-04-14

## 2017-01-10 NOTE — Telephone Encounter (Signed)
I was considering giving her Fosamax, but given her T score of -2.3 she probably should be taking it. Please send in prescription please.

## 2017-01-10 NOTE — Telephone Encounter (Signed)
Left voice mail message for patient that prescription sent to [facility] and that T score from August Bone density was -2.3

## 2017-01-10 NOTE — Telephone Encounter (Signed)
Patient called answering service to request refill of "Fomax" for calcium. I do not seen Fosamax on her medicine list nor do I see that she has had a bone scan or Dexa scan recently. Please advise

## 2017-01-18 NOTE — Progress Notes (Signed)
  Oncology Nurse Navigator Documentation  Navigator Location: [facility] (01/18/17 1300)   )Navigator Encounter Type: Telephone (01/18/17 1300) Telephone: Symptom Mgt (01/18/17 1300)                     Treatment Phase: Post-Tx Follow-up (01/18/17 1300)                            Time Spent with Patient: 30 (01/18/17 1300)   Patient called with complaints of "muscle pain" continuing eventt hough surgery was in April.  Recommended she attend Lymphedema class and talk to OT about appropriate exercises.

## 2017-02-14 ENCOUNTER — Telehealth: Payer: Self-pay | Admitting: *Deleted

## 2017-02-14 MED ORDER — TAMOXIFEN CITRATE 20 MG PO TABS: 20.0000 mg | ORAL_TABLET | Freq: Every day | ORAL | 0 refills | Status: DC

## 2017-02-14 NOTE — Telephone Encounter (Signed)
Patient called reporting that the change in her AI has not helped with her side effects of pain., head ache, mood swings. Please advise

## 2017-02-14 NOTE — Telephone Encounter (Signed)
Discontinue anastrozole and start tamoxifen in 2-3 weeks.  Please call in a prescription for 20 mg daily.  Call back in a month to report her toleration of tamoxifen.  Keep follow-up as scheduled.

## 2017-02-14 NOTE — Telephone Encounter (Signed)
Left on patient voice mail that she is to stop Letrozole and wait 3 weeks then start Tamoxifen

## 2017-02-14 NOTE — Addendum Note (Signed)
Addended by: Betti Cruz on: 02/14/2017 02:30 PM   Modules accepted: Orders

## 2017-02-14 NOTE — Telephone Encounter (Signed)
Patient states she does not wants to come off anastrozole at this time because she was off Letrozole for 3 weeks before starting the anastrozole. She wants to stay on it for a little longer and if she can't take it, she will take the Tamoxifen then.

## 2017-04-12 ENCOUNTER — Other Ambulatory Visit: Payer: Self-pay | Admitting: Surgery

## 2017-04-12 DIAGNOSIS — Z853 Personal history of malignant neoplasm of breast: Secondary | ICD-10-CM

## 2017-04-13 NOTE — Progress Notes (Signed)
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 Oncotype DX 24, intermediate risk.  INTERVAL HISTORY: Patient returns to clinic today for routine 59-monthevaluation. She has multiple complaints including joint pain, hot flashes, myalgias. She has no neurologic complaints. She denies any recent fevers or illnesses. She has a good appetite and denies weight loss. She has no chest pain or shortness of breath. She denies any nausea, vomiting, constipation, or diarrhea. She has no urinary complaints. Patient offers no specific complaints today.  REVIEW OF SYSTEMS:   Review of Systems  Constitutional: Negative.  Negative for fever, malaise/fatigue and weight loss.  Respiratory: Negative.  Negative for cough and shortness of breath.   Cardiovascular: Negative.  Negative for chest pain and leg swelling.  Gastrointestinal: Negative.  Negative for abdominal pain.  Genitourinary: Negative.   Musculoskeletal: Positive for joint pain and myalgias.  Skin: Negative.  Negative for rash.  Neurological: Positive for sensory change.  Psychiatric/Behavioral: The patient is nervous/anxious.     As per HPI. Otherwise, a complete review of systems is negative.  PAST MEDICAL HISTORY: Past Medical History:  Diagnosis Date  . Anemia   . Anxiety   . Arthritis    LOWER BACK  . [place]   right breast INVASIVE MAMMARY CARCINOMA  . Cervical dysplasia   . DDD (degenerative disc disease), lumbar     PAST SURGICAL HISTORY: Past Surgical History:  Procedure Laterality Date  . BREAST BIOPSY Right 06/11/2016   INVASIVE MAMMARY CARCINOMA  . BREAST LUMPECTOMY Right 07/08/2016   INVASIVE MAMMARY CARCINOMA  . CERVIX SURGERY    . PARTIAL MASTECTOMY WITH NEEDLE LOCALIZATION Right 07/08/2016   Procedure: PARTIAL MASTECTOMY WITH NEEDLE LOCALIZATION;  Surgeon: JLeonie Green MD;  Location: [facility] ORS;  Service: General;  Laterality: Right;  . SENTINEL NODE BIOPSY Right 07/08/2016   Procedure: SENTINEL NODE BIOPSY;  Surgeon: JLeonie Green MD;  Location: [facility] ORS;  Service: General;  Laterality: Right;  . WISDOM TOOTH EXTRACTION     AGE 57 OR 11    FAMILY HISTORY: Family History  Problem Relation Age of Onset  . Breast cancer Neg Hx     ADVANCED DIRECTIVES (Y/N):  N  HEALTH MAINTENANCE: Social History   Tobacco Use  . Smoking status: Former Smoker    Years: 2.00    Types: Cigarettes    Last attempt to quit: 07/01/1996    Years since quitting: 20.8  . Smokeless tobacco: Never Used  . Tobacco comment: ONLY WEEKEND SMOKER  Substance Use Topics  . Alcohol use: No  . Drug use: No     Colonoscopy:  PAP:  Bone density:  Lipid panel:  No Known Allergies  Current Outpatient Medications  Medication Sig Dispense Refill  . ALPRAZolam (XANAX) 0.25 MG tablet Take 1 tablet by mouth daily as needed.    .Marland KitchenHYDROcodone-acetaminophen (NORCO) 10-325 MG tablet Take 1 tablet by mouth daily as needed.    . traMADol (ULTRAM) 50 MG tablet Take 50 mg by mouth every 6 (six) hours as needed.    . zolpidem (AMBIEN) 10 MG tablet Take 1 tablet by mouth at bedtime.     No current facility-administered medications for this visit.  OBJECTIVE: Vitals:   04/14/17 1603  BP: (!) 141/83  Pulse: 71  Resp: 20  Temp: 97.8 F (36.6 C)     Body mass index is 21.8 kg/m.    ECOG FS:0 - Asymptomatic  General: Well-developed, well-nourished, no acute distress. Eyes: Pink conjunctiva, anicteric sclera. Breasts: Right breast with well-healing surgical scar. Axilla without lymphadenopathy. Exam deferred today. Lungs: Clear to auscultation bilaterally. Heart: Regular rate and  rhythm. No rubs, murmurs, or gallops. Abdomen: Soft, nontender, nondistended. No organomegaly noted, normoactive bowel sounds. Musculoskeletal: No edema, cyanosis, or clubbing. Neuro: Alert, answering all questions appropriately. Cranial nerves grossly intact. Skin: No rashes or petechiae noted. Psych: Normal affect.   LAB RESULTS:  No results found for: NA, K, CL, CO2, GLUCOSE, BUN, CREATININE, CALCIUM, PROT, ALBUMIN, AST, ALT, ALKPHOS, BILITOT, GFRNONAA, GFRAA  Lab Results  Component Value Date   WBC 6.0 09/09/2016   HGB 9.3 (L) 09/09/2016   HCT 28.8 (L) 09/09/2016   MCV 66.4 (L) 09/09/2016   PLT 184 09/09/2016     STUDIES: No results found.  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 Oncotype DX 24, intermediate risk.  PLAN:    1.  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 Although patient has an intermediate risk Oncotype, she declined adjuvant chemotherapy. Given the small size of her tumor, it was agreed upon that is likely not necessary. Patient has now completed XRT.  Despite side effects, patient has agreed to continue treatment with anastrozole completing a total of 5 years in July 2023.  She will be due for follow-up mammogram in February 2019.  Return to clinic in 6 months for routine evaluation.   2. Osteopenia: Bone mineral density on November 23, 2016 revealed a T score of -2.3. Patient was instructed to initiate calcium and vitamin D. Consider Fosamax in the future.   Patient expressed understanding and was in agreement with this plan. She also understands that She can call clinic at any time with any questions, concerns, or complaints.   Approximately 20 minutes was spent in discussion of which greater than 50% was consultation.  Cancer Staging Primary cancer  of upper outer quadrant of right female breast [facility]) Staging form: Breast, AJCC 8th Edition - Clinical stage from 06/15/2016: Stage IA (cT1b, cN0, cM0, G2, ER: Positive, PR: Positive, HER2: Negative) - Signed by Lloyd Huger, MD on 07/24/2016 - Pathologic stage from 07/24/2016: Stage IA (pT1b, pN0, cM0, G2, ER: Positive, PR: Positive, HER2: Negative, Oncotype DX score: 24) - Signed by Lloyd Huger, MD on 07/24/2016   Lloyd Huger, MD   04/17/2017 11:41 AM

## 2017-04-14 ENCOUNTER — Inpatient Hospital Stay: Payer: 59 | Attending: Oncology | Admitting: Oncology

## 2017-04-14 ENCOUNTER — Inpatient Hospital Stay: Payer: 59 | Admitting: Oncology

## 2017-04-14 ENCOUNTER — Encounter: Payer: Self-pay | Admitting: Oncology

## 2017-04-14 ENCOUNTER — Other Ambulatory Visit: Payer: Self-pay

## 2017-04-14 VITALS — BP 141/83 | HR 71 | Temp 97.8°F | Resp 20 | Wt 127.0 lb

## 2017-04-14 DIAGNOSIS — M8580 Other specified disorders of bone density and structure, unspecified site: Secondary | ICD-10-CM | POA: Diagnosis not present

## 2017-04-14 DIAGNOSIS — C50411 Malignant neoplasm of upper-outer quadrant of right female breast: Secondary | ICD-10-CM | POA: Diagnosis present

## 2017-04-14 DIAGNOSIS — Z170 Estrogen receptor positive status [ER+]: Secondary | ICD-10-CM

## 2017-04-14 DIAGNOSIS — Z923 Personal history of irradiation: Secondary | ICD-10-CM

## 2017-04-14 DIAGNOSIS — Z79811 Long term (current) use of aromatase inhibitors: Secondary | ICD-10-CM | POA: Insufficient documentation

## 2017-04-14 NOTE — Progress Notes (Signed)
Patient here today for follow up regarding breast cancer. Patient reports hot flashes and body aches with anastrozole.

## 2017-04-27 ENCOUNTER — Other Ambulatory Visit: Payer: Self-pay | Admitting: *Deleted

## 2017-04-27 ENCOUNTER — Encounter: Payer: Self-pay | Admitting: Radiation Oncology

## 2017-04-27 ENCOUNTER — Ambulatory Visit
Admission: RE | Admit: 2017-04-27 | Discharge: 2017-04-27 | Disposition: A | Discharge status: Home / Self Care | Payer: 59 | Source: Ambulatory Visit | Attending: Radiation Oncology | Admitting: Radiation Oncology

## 2017-04-27 ENCOUNTER — Other Ambulatory Visit: Payer: Self-pay

## 2017-04-27 VITALS — BP 130/59 | HR 87 | Temp 98.9°F | Resp 12 | Ht 64.0 in | Wt 125.7 lb

## 2017-04-27 DIAGNOSIS — Z170 Estrogen receptor positive status [ER+]: Secondary | ICD-10-CM | POA: Insufficient documentation

## 2017-04-27 DIAGNOSIS — Z79811 Long term (current) use of aromatase inhibitors: Secondary | ICD-10-CM | POA: Diagnosis not present

## 2017-04-27 DIAGNOSIS — C50411 Malignant neoplasm of upper-outer quadrant of right female breast: Secondary | ICD-10-CM | POA: Diagnosis present

## 2017-04-27 DIAGNOSIS — Z923 Personal history of irradiation: Secondary | ICD-10-CM | POA: Insufficient documentation

## 2017-04-27 DIAGNOSIS — M25511 Pain in right shoulder: Secondary | ICD-10-CM | POA: Diagnosis not present

## 2017-04-27 NOTE — Progress Notes (Signed)
Radiation Oncology Follow up Note  Name: Erin Stevens   Date:   04/27/2017 MRN:  [id number] DOB: [birth year]    This 57 y.o. female presents to the clinic today for six-month follow-up status post whole breast radiation to her right breast for stage I ER/PR positive invasive mammary carcinoma.  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 She is seen today in routine follow-up. She continues to have right shoulder pain pain on stretching and movement of her right shoulder. She is currently on. Arimadex tolerating that well without side effect. She is scheduled for follow-up mammogram next month.  COMPLICATIONS OF TREATMENT: none  FOLLOW UP COMPLIANCE: keeps appointments   PHYSICAL EXAM:  BP (!) 130/59 (BP Location: Left Arm, Patient Position: Sitting, Cuff Size: Normal)   Pulse 87   Temp 98.9 F (37.2 C) (Tympanic)   Resp 12   Ht _0  (1.626 m)   Wt 125 lb 10.6 oz (57 kg)   BMI 21.57 kg/m  Lungs are clear to A&P cardiac examination essentially unremarkable with regular rate and rhythm. No dominant mass or nodularity is noted in either breast in 2 positions examined. Incision is well-healed. No axillary or supraclavicular adenopathy is appreciated. Cosmetic result is excellent. Well-developed well-nourished patient in NAD. HEENT reveals PERLA, EOMI, discs not visualized.  Oral cavity is clear. No oral mucosal lesions are identified. Neck is clear without evidence of cervical or supraclavicular adenopathy. Lungs are clear to A&P. Cardiac examination is essentially unremarkable with regular rate and rhythm without murmur rub or thrill. Abdomen is benign with no organomegaly or masses noted. Motor sensory and DTR levels are equal and symmetric in the upper and lower extremities. Cranial nerves II through XII are  grossly intact. Proprioception is intact. No peripheral adenopathy or edema is identified. No motor or sensory levels are noted. Crude visual fields are within normal range.  RADIOLOGY RESULTS: No current films for review  PLAN: At the present time she is doing well I am referring her to physical therapy for evaluation. They may be able help with exercise her right upper extremity to break up some of the scar tissue that is undoubtedly causing her right shoulder pain. I've otherwise asked to see her back in 6 months for follow-up. Will review her mammograms when it become available. She continues on arimadex without side effect.  I would like to take this opportunity to thank you for allowing me to participate in the care of your patient.Noreene Filbert, MD

## 2017-05-05 ENCOUNTER — Telehealth: Payer: Self-pay | Admitting: Pharmacist

## 2017-05-05 NOTE — Telephone Encounter (Signed)
Oral Chemotherapy Pharmacist Encounter   A voicemail message was sent to me stating that Ms. Erin Stevens called worried that she had missed some of her anastrazole.   I gave Ms. Erin Stevens a call to find out some more information. She stated she missed 1 or 2 days of her medication because she had a cold and simply forgot. I assured the patient that it is was fine that she missed a few days, we would start be concerned if she was missing weeks of their medication. She was relieved to hear this.  Darl Pikes, PharmD, BCPS Hematology/Oncology Clinical Pharmacist [facility] [phone]  05/05/2017 1:26 PM

## 2017-05-20 ENCOUNTER — Other Ambulatory Visit: Payer: 59

## 2017-05-24 ENCOUNTER — Ambulatory Visit
Admission: RE | Admit: 2017-05-24 | Discharge: 2017-05-24 | Disposition: A | Discharge status: Home / Self Care | Payer: 59 | Source: Ambulatory Visit | Attending: Surgery | Admitting: Surgery

## 2017-05-24 DIAGNOSIS — Z853 Personal history of malignant neoplasm of breast: Secondary | ICD-10-CM | POA: Insufficient documentation

## 2017-05-24 HISTORY — DX: Personal history of irradiation: Z92.3

## 2017-05-25 ENCOUNTER — Ambulatory Visit: Payer: 59 | Admitting: Physical Therapy

## 2017-06-01 ENCOUNTER — Encounter: Payer: 59 | Admitting: Physical Therapy

## 2017-06-27 IMAGING — MG MM BREAST SURGICAL SPECIMEN
1 series · 1 of 1 positions shown · non-contrast
Comparison: Previous exam(s).

CLINICAL DATA: Status post right lumpectomy

EXAM:
SPECIMEN RADIOGRAPH OF THE RIGHT BREAST

[R SPECIMEN]
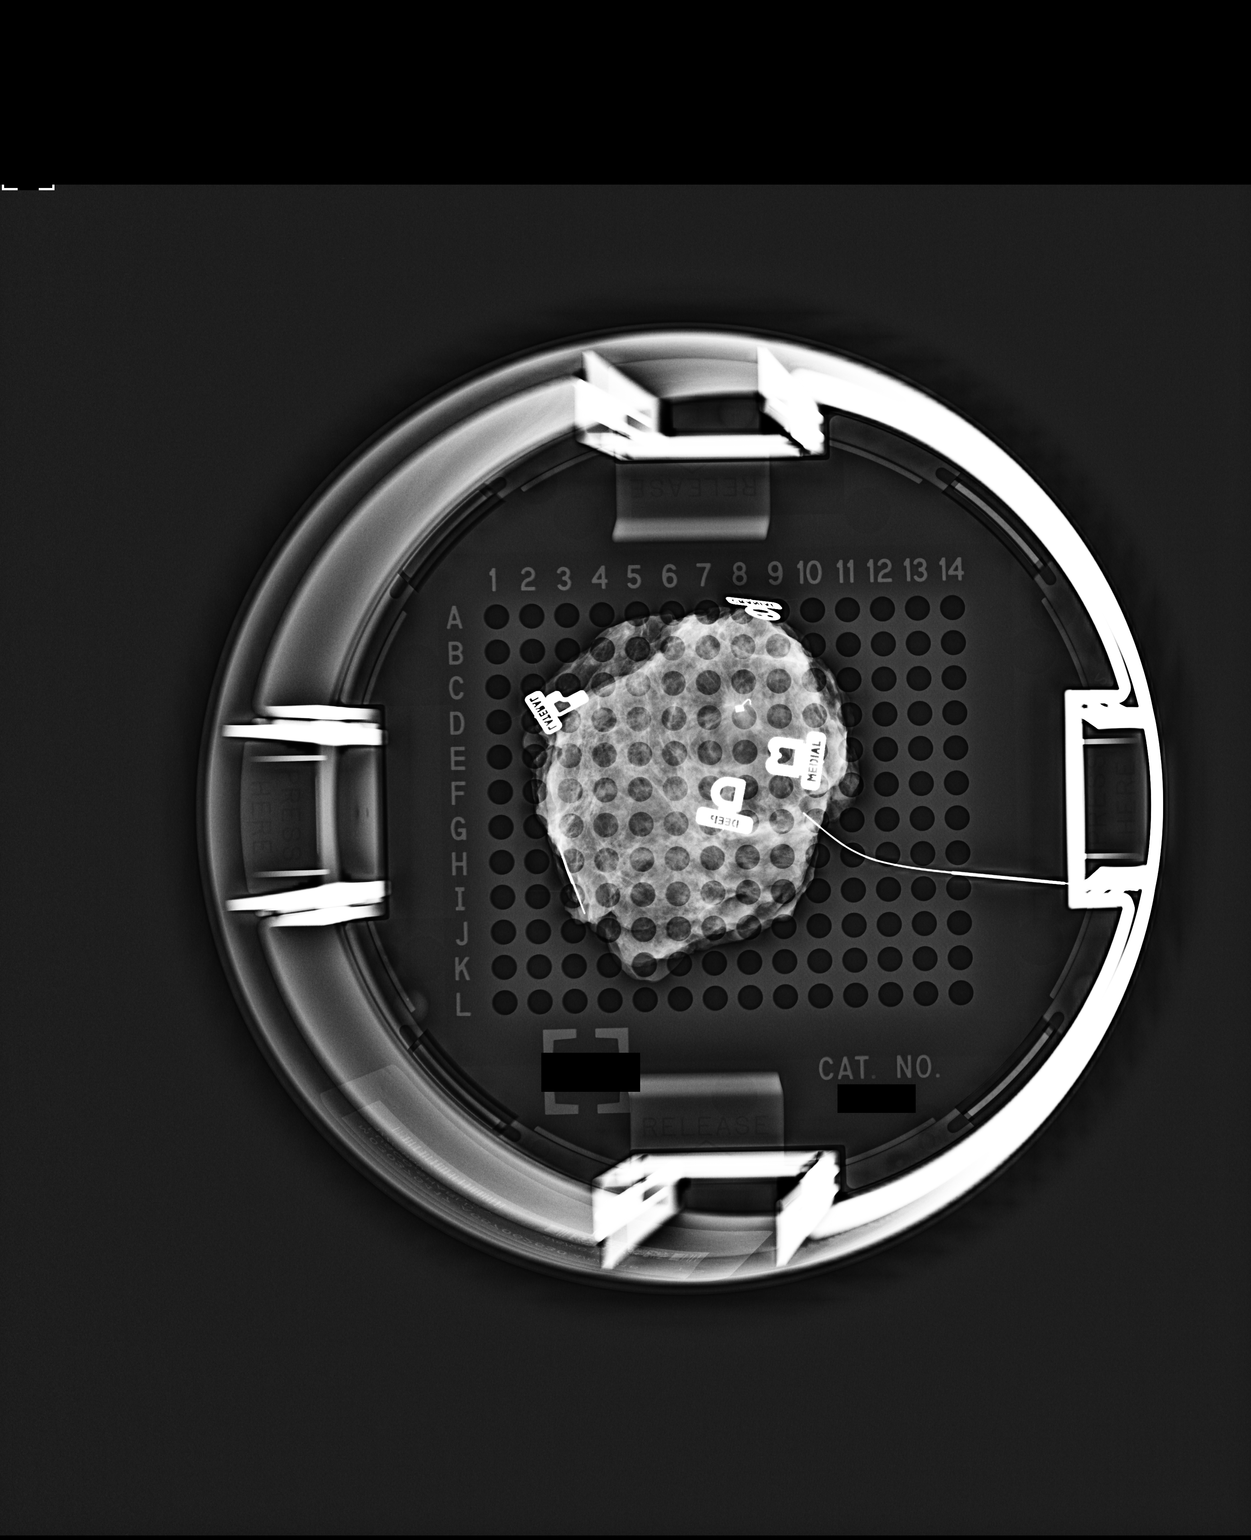

[1 of 1 positions shown; findings below may reference images not displayed]

FINDINGS: Status post excision of the right breast. The wire tip and biopsy
marker clip are present and are marked for pathology. Findings were
communicated to Dr. Martes in the operating room via telephone at the
time of image acquisition.
IMPRESSION: Specimen radiograph of the right breast.

## 2017-07-20 ENCOUNTER — Telehealth: Payer: Self-pay | Admitting: Oncology

## 2017-07-20 NOTE — Telephone Encounter (Signed)
Pt requesting a later time for appts. Called to change appt time.

## 2017-10-26 ENCOUNTER — Ambulatory Visit: Payer: 59 | Admitting: Radiation Oncology

## 2017-11-02 ENCOUNTER — Ambulatory Visit: Payer: 59 | Admitting: Radiation Oncology

## 2017-11-02 ENCOUNTER — Ambulatory Visit: Payer: 59 | Admitting: Oncology

## 2017-11-07 NOTE — Progress Notes (Signed)
[facility]  [phone] [phone]) [phone]  ID: Erin Stevens OB: 1961/02/05  MR#: [id number]  CHE#:[id number]  Patient Care Team: [name], MD as PCP - General (Family Medicine)   CHIEF COMPLAINT: Pathologic stage IA ER/PR positive, HER-2/neu negative  invasive carcinoma of the upper outer quadrant of the right breast. Oncotype DX 24, intermediate risk.  INTERVAL HISTORY: Patient returns to clinic today for routine six-month evaluation.  She continues to have joint pain and hot flashes which she attributes to anastrozole.  They have mildly improved over the past several months.  She otherwise feels well.  She has no neurologic complaints. She denies any recent fevers or illnesses. She has a good appetite and denies weight loss. She has no chest pain or shortness of breath. She denies any nausea, vomiting, constipation, or diarrhea. She has no urinary complaints.  Patient offers no further specific complaints today.  REVIEW OF SYSTEMS:   Review of Systems  Constitutional: Negative.  Negative for fever, malaise/fatigue and weight loss.  Respiratory: Negative.  Negative for cough and shortness of breath.   Cardiovascular: Negative.  Negative for chest pain and leg swelling.  Gastrointestinal: Negative.  Negative for abdominal pain.  Genitourinary: Negative.   Musculoskeletal: Positive for joint pain. Negative for myalgias.  Skin: Negative.  Negative for rash.  Neurological: Positive for sensory change. Negative for focal weakness, weakness and headaches.  Psychiatric/Behavioral: The patient is nervous/anxious.     As per HPI. Otherwise, a complete review of systems is negative.  PAST MEDICAL HISTORY: Past Medical History:  Diagnosis Date  . Anemia   . Anxiety   . Arthritis    LOWER BACK  . Breast cancer ([place]) 06/2016   right breast INVASIVE MAMMARY CARCINOMA  . Cervical dysplasia   . DDD (degenerative disc disease), lumbar   . Personal history  of radiation therapy     PAST SURGICAL HISTORY: Past Surgical History:  Procedure Laterality Date  . BREAST BIOPSY Right 06/11/2016   INVASIVE MAMMARY CARCINOMA  . BREAST LUMPECTOMY Right 07/08/2016   INVASIVE MAMMARY CARCINOMA  . CERVIX SURGERY    . PARTIAL MASTECTOMY WITH NEEDLE LOCALIZATION Right 07/08/2016   Procedure: PARTIAL MASTECTOMY WITH NEEDLE LOCALIZATION;  Surgeon: Leonie Green, MD;  Location: [facility] ORS;  Service: General;  Laterality: Right;  . SENTINEL NODE BIOPSY Right 07/08/2016   Procedure: SENTINEL NODE BIOPSY;  Surgeon: Leonie Green, MD;  Location: [facility] OR[facility];  Service: General;  Laterality: Right;  . WISDOM TOOTH EXTRACTION     AGE 57 OR 11    FAMILY HISTORY: Family History  Problem Relation Age of Onset  . Breast cancer Neg Hx     ADVANCED DIRECTIVES (Y/N):  N  HEALTH MAINTENANCE: Social History   Tobacco Use  . Smoking status: Former Smoker    Years: 2.00    Types: Cigarettes    Last attempt to quit: 07/01/1996    Years since quitting: 21.3  . Smokeless tobacco: Never Used  . Tobacco comment: ONLY WEEKEND SMOKER  Substance Use Topics  . Alcohol use: No  . Drug use: No     Colonoscopy:  PAP:  Bone density:  Lipid panel:  No Known Allergies  Current Outpatient Medications  Medication Sig Dispense Refill  . ALPRAZolam (XANAX) 0.25 MG tablet Take 1 tablet by mouth daily as needed.    Marland Kitchen anastrozole (ARIMIDEX) 1 MG tablet Take 1 tablet by mouth daily.  11  . HYDROcodone-acetaminophen (NORCO) 10-325 MG tablet Take 1  tablet by mouth daily as needed.    . traMADol (ULTRAM) 50 MG tablet Take 50 mg by mouth every 6 (six) hours as needed.    . zolpidem (AMBIEN) 10 MG tablet Take 1 tablet by mouth at bedtime.     No current facility-administered medications for this visit.     OBJECTIVE: Vitals:   11/10/17 1532  BP: 122/76  Pulse: 70  Resp: 18  Temp: 97.7 F (36.5 C)     Body mass index is 21.97 kg/m.    ECOG FS:0 -  Asymptomatic  General: Well-developed, well-nourished, no acute distress. Eyes: Pink conjunctiva, anicteric sclera. HEENT: Normocephalic, moist mucous membranes. Breast: Patient request exam deferred today. Lungs: Clear to auscultation bilaterally. Heart: Regular rate and rhythm. No rubs, murmurs, or gallops. Abdomen: Soft, nontender, nondistended. No organomegaly noted, normoactive bowel sounds. Musculoskeletal: No edema, cyanosis, or clubbing. Neuro: Alert, answering all questions appropriately. Cranial nerves grossly intact. Skin: No rashes or petechiae noted. Psych: Normal affect.  LAB RESULTS:  No results found for: NA, K, CL, CO2, GLUCOSE, BUN, CREATININE, CALCIUM, PROT, ALBUMIN, AST, ALT, ALKPHOS, BILITOT, GFRNONAA, GFRAA  Lab Results  Component Value Date   WBC 6.0 09/09/2016   HGB 9.3 (L) 09/09/2016   HCT 28.8 (L) 09/09/2016   MCV 66.4 (L) 09/09/2016   PLT 184 09/09/2016     STUDIES: No results found.  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 Oncotype DX 24, intermediate risk.  PLAN:    1.  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 Although patient has an intermediate risk Oncotype, she declined adjuvant chemotherapy. Given the small size of her tumor, it was agreed upon that is likely not necessary. Patient completed adjuvant XRT.  Despite persistent hot flashes and joint pain, patient wishes to continue treatment with anastrozole completed 5 years of treatment in July 2023.  She previously could not tolerate letrozole.  Her most recent mammogram on May 24, 2017 was reported as BI-RADS 2.  Repeat in February 2020.  Return to clinic in 6 months for routine evaluation. 2. Osteopenia: Bone mineral density on November 23, 2016 revealed a T score of -2.3. Patient  was instructed to initiate calcium and vitamin D. Consider Fosamax in the future.  3.  Hot flashes/joint pain: Possibly secondary anastrozole.  Patient was offered to change treatments which she declined.  I spent a total of 20 minutes face-to-face with the patient of which greater than 50% of the visit was spent in counseling and coordination of care as detailed above.   Patient expressed understanding and was in agreement with this plan. She also understands that She can call clinic at any time with any questions, concerns, or complaints.    Cancer Staging Primary cancer of upper outer quadrant of right female breast [facility]) Staging form: Breast, AJCC 8th Edition - Clinical stage from 06/15/2016: Stage IA (cT1b, cN0, cM0, G2, ER: Positive, PR: Positive, HER2: Negative) - Signed by Lloyd Huger, MD on 07/24/2016 - Pathologic stage from 07/24/2016: Stage IA (pT1b, pN0, cM0, G2, ER: Positive, PR: Positive, HER2: Negative, Oncotype DX score: 24) - Signed by Lloyd Huger, MD on 07/24/2016   Lloyd Huger, MD   11/14/2017 8:31 AM

## 2017-11-10 ENCOUNTER — Inpatient Hospital Stay: Payer: 59 | Attending: Oncology | Admitting: Oncology

## 2017-11-10 ENCOUNTER — Encounter: Payer: Self-pay | Admitting: Oncology

## 2017-11-10 ENCOUNTER — Ambulatory Visit: Payer: 59 | Admitting: Radiation Oncology

## 2017-11-10 VITALS — BP 122/76 | HR 70 | Temp 97.7°F | Resp 18 | Wt 128.0 lb

## 2017-11-10 DIAGNOSIS — Z79811 Long term (current) use of aromatase inhibitors: Secondary | ICD-10-CM

## 2017-11-10 DIAGNOSIS — F419 Anxiety disorder, unspecified: Secondary | ICD-10-CM | POA: Insufficient documentation

## 2017-11-10 DIAGNOSIS — Z79899 Other long term (current) drug therapy: Secondary | ICD-10-CM | POA: Diagnosis not present

## 2017-11-10 DIAGNOSIS — Z170 Estrogen receptor positive status [ER+]: Secondary | ICD-10-CM | POA: Insufficient documentation

## 2017-11-10 DIAGNOSIS — M8580 Other specified disorders of bone density and structure, unspecified site: Secondary | ICD-10-CM | POA: Diagnosis not present

## 2017-11-10 DIAGNOSIS — Z87891 Personal history of nicotine dependence: Secondary | ICD-10-CM | POA: Insufficient documentation

## 2017-11-10 DIAGNOSIS — C50411 Malignant neoplasm of upper-outer quadrant of right female breast: Secondary | ICD-10-CM | POA: Insufficient documentation

## 2017-11-10 NOTE — Progress Notes (Signed)
Pt reports "some soreness still in right breast but nothing new".  Had mammogram, deferred breast exam today.

## 2017-11-16 ENCOUNTER — Ambulatory Visit
Admission: RE | Admit: 2017-11-16 | Discharge: 2017-11-16 | Disposition: A | Discharge status: Home / Self Care | Payer: 59 | Source: Ambulatory Visit | Attending: Radiation Oncology | Admitting: Radiation Oncology

## 2017-11-16 ENCOUNTER — Other Ambulatory Visit: Payer: Self-pay

## 2017-11-16 ENCOUNTER — Encounter: Payer: Self-pay | Admitting: Radiation Oncology

## 2017-11-16 VITALS — BP 126/68 | HR 70 | Temp 97.6°F | Wt 129.9 lb

## 2017-11-16 DIAGNOSIS — Z170 Estrogen receptor positive status [ER+]: Secondary | ICD-10-CM | POA: Diagnosis not present

## 2017-11-16 DIAGNOSIS — Z79811 Long term (current) use of aromatase inhibitors: Secondary | ICD-10-CM | POA: Diagnosis not present

## 2017-11-16 DIAGNOSIS — M2550 Pain in unspecified joint: Secondary | ICD-10-CM | POA: Insufficient documentation

## 2017-11-16 DIAGNOSIS — Z923 Personal history of irradiation: Secondary | ICD-10-CM | POA: Diagnosis not present

## 2017-11-16 DIAGNOSIS — C50411 Malignant neoplasm of upper-outer quadrant of right female breast: Secondary | ICD-10-CM | POA: Diagnosis present

## 2017-11-16 NOTE — Progress Notes (Signed)
Radiation Oncology Follow up Note  Name: Erin Stevens   Date:   11/16/2017 MRN:  [id number] DOB: Aug 17, 1960    This 57 y.o. female presents to the clinic today for 1 year follow-up status post whole breast radiation to her right breast for stage I ER/PR positive invasive mammary carcinoma.  REFERRING PROVIDER: [name], MD  HPI: patient is a 57 year old female now seen out 1 year having dictated whole breast radiation to her right breast for stage I ER/PR positive invasive mammary carcinoma. Seen today in routine follow-up she is doing well. She specifically denies breast tenderness cough or bone pain. She has been having some exacerbation of joint pain most likely from her.arimadex. She had mammograms back in February which I have reviewed were BI-RADS 2 benign.  COMPLICATIONS OF TREATMENT: none  FOLLOW UP COMPLIANCE: keeps appointments   PHYSICAL EXAM:  BP 126/68 (BP Location: Left Arm, Patient Position: Sitting)   Pulse 70   Temp 97.6 F (36.4 C) (Tympanic)   Wt 129 lb 13.6 oz (58.9 kg)   BMI 22.29 kg/m  Lungs are clear to A&P cardiac examination essentially unremarkable with regular rate and rhythm. No dominant mass or nodularity is noted in either breast in 2 positions examined. Incision is well-healed. No axillary or supraclavicular adenopathy is appreciated. Cosmetic result is excellent.Well-developed well-nourished patient in NAD. HEENT reveals PERLA, EOMI, discs not visualized.  Oral cavity is clear. No oral mucosal lesions are identified. Neck is clear without evidence of cervical or supraclavicular adenopathy. Lungs are clear to A&P. Cardiac examination is essentially unremarkable with regular rate and rhythm without murmur rub or thrill. Abdomen is benign with no organomegaly or masses noted. Motor sensory and DTR levels are equal and symmetric in the upper and lower extremities. Cranial nerves II through XII are grossly intact. Proprioception is intact. No peripheral  adenopathy or edema is identified. No motor or sensory levels are noted. Crude visual fields are within normal range.  RADIOLOGY RESULTS: ammograms are reviewed and compatible with the above-stated findings  PLAN: present time patient is doing well with no evidence of disease. We have discussed switching from anastrozole although she'll retain her current regiment and if symptoms of arthritis worsened she may contact medical oncology for opinion. I've otherwise asked to see her back in 1 year for follow-up. She knows to call with any concerns.  I would like to take this opportunity to thank you for allowing me to participate in the care of your patient.Noreene Filbert, MD

## 2017-12-08 ENCOUNTER — Other Ambulatory Visit: Payer: 59

## 2017-12-18 ENCOUNTER — Other Ambulatory Visit: Payer: Self-pay | Admitting: Oncology

## 2017-12-21 ENCOUNTER — Ambulatory Visit
Admission: RE | Admit: 2017-12-21 | Discharge: 2017-12-21 | Disposition: A | Discharge status: Home / Self Care | Payer: 59 | Source: Ambulatory Visit | Attending: Oncology | Admitting: Oncology

## 2017-12-21 DIAGNOSIS — C50411 Malignant neoplasm of upper-outer quadrant of right female breast: Secondary | ICD-10-CM | POA: Diagnosis present

## 2018-01-10 ENCOUNTER — Telehealth: Payer: Self-pay | Admitting: *Deleted

## 2018-01-10 NOTE — Telephone Encounter (Signed)
Patient called asking for Bone scan results.  ASSESSMENT: The BMD measured at Femur Neck Right is 0.710 g/cm2 with a T-score of -2.4. This patient is considered osteopenic according to [facility]) criteria.  I attempted to call patient and got her voice mail. I left a message for her to call me back

## 2018-02-22 ENCOUNTER — Telehealth: Payer: Self-pay | Admitting: *Deleted

## 2018-02-22 MED ORDER — TAMOXIFEN CITRATE 20 MG PO TABS: 20.0000 mg | ORAL_TABLET | Freq: Every day | ORAL | 2 refills | Status: DC

## 2018-02-22 NOTE — Telephone Encounter (Signed)
Patient called requesting to be changed off her Anastrozole citing that she hurts all over in her bones and is miserable. Asking what the process is to change it is. Please advise

## 2018-02-22 NOTE — Telephone Encounter (Signed)
Patient informed of medicine change I told her she could stop anastrozole and wait a week before starting the Tamoxifen if she wanted to and she wants to wait to start new medicine 1 week

## 2018-02-22 NOTE — Telephone Encounter (Signed)
Patient has borderline osteoporosis, so lets switch her to tamoxifen 20mg  daily.

## 2018-05-13 IMAGING — MG MM DIGITAL DIAGNOSTIC BILAT W/ TOMO W/ CAD
9 of 17 series · 9 of 37 positions shown · non-contrast
Comparison: Previous exam(s).

CLINICAL DATA: 57-year-old female with history of right breast
cancer post lumpectomy 07/08/2016 followed by radiation therapy.
Patient is extremely sensitive to compression or palpation of the
right breast.

EXAM:
DIGITAL DIAGNOSTIC BILATERAL MAMMOGRAM WITH CAD AND TOMO

[R MLO (1 of 3)]
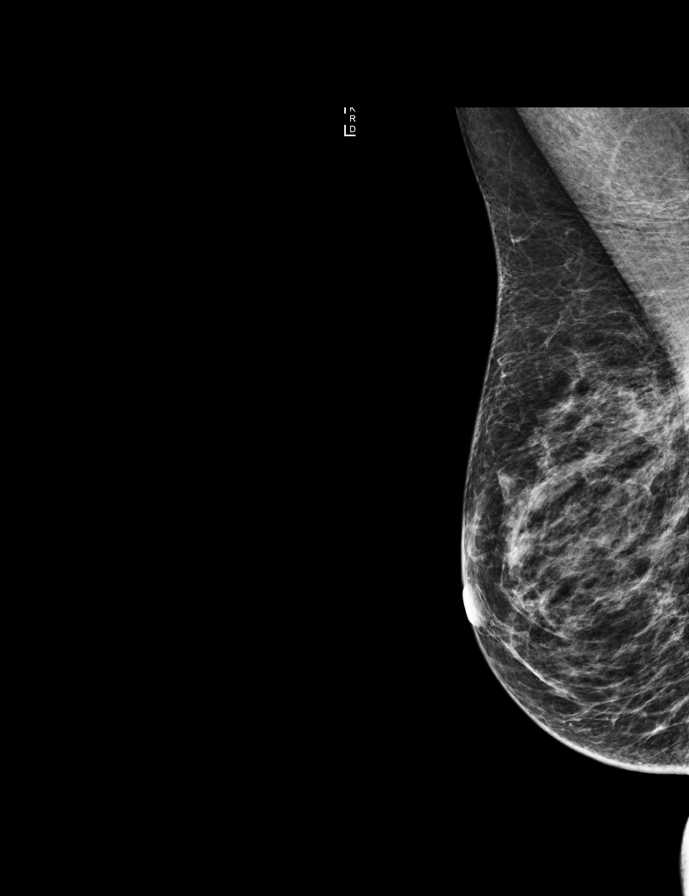

[R MLO (2 of 3)]
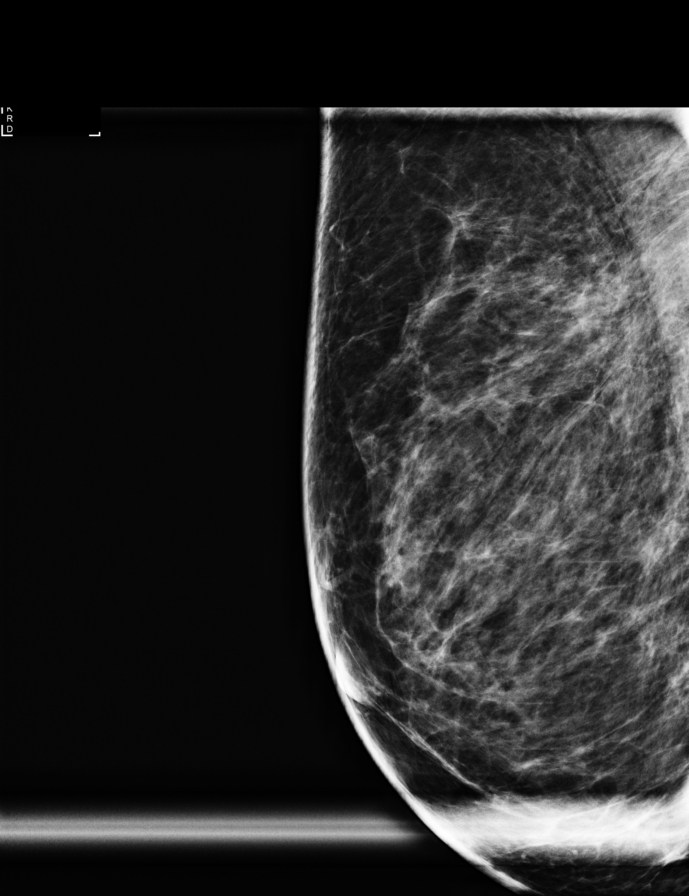

[L CC synth-2D]
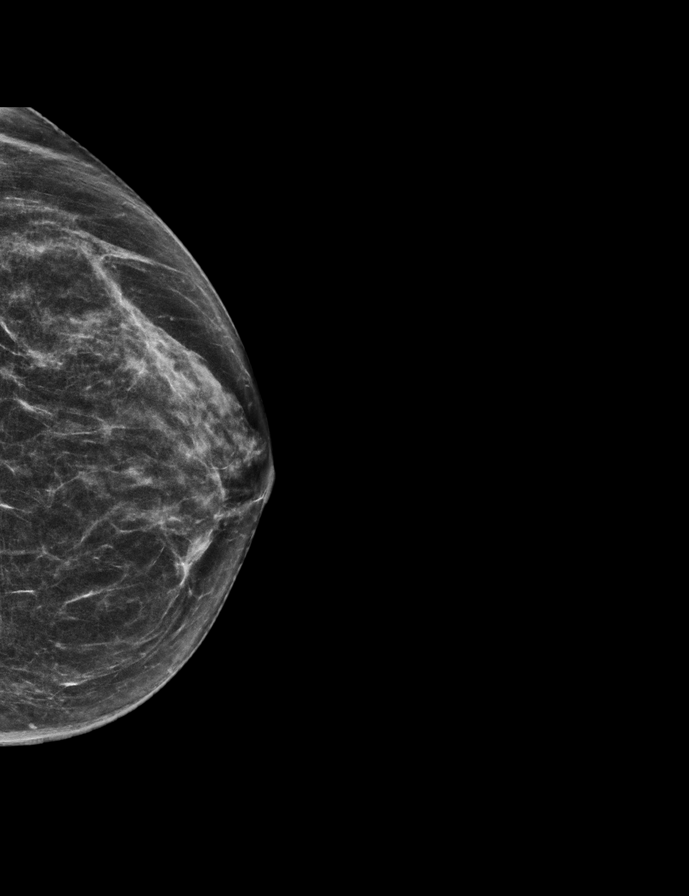

[L MLO synth-2D]
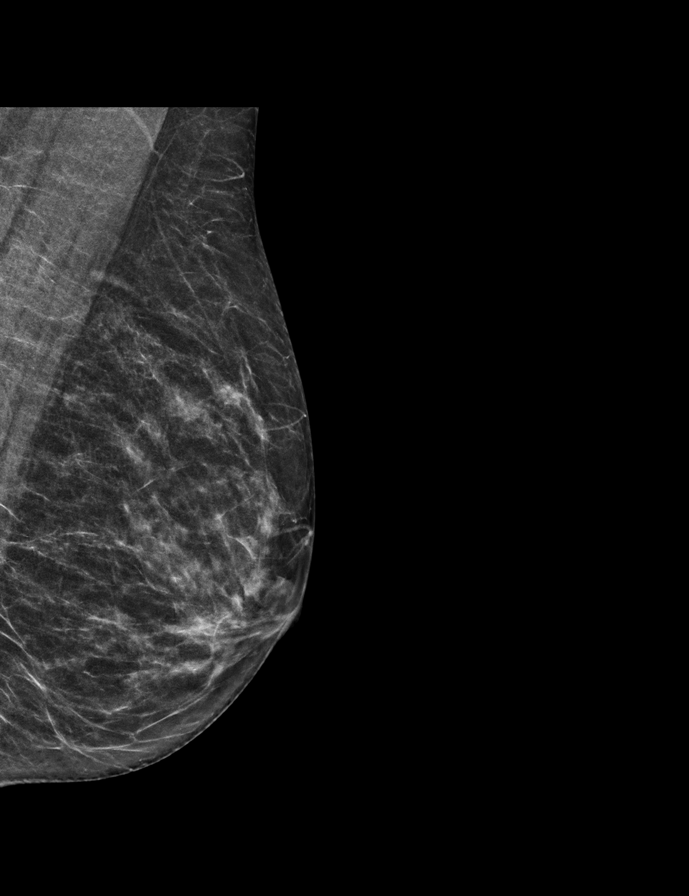

[L MLO]
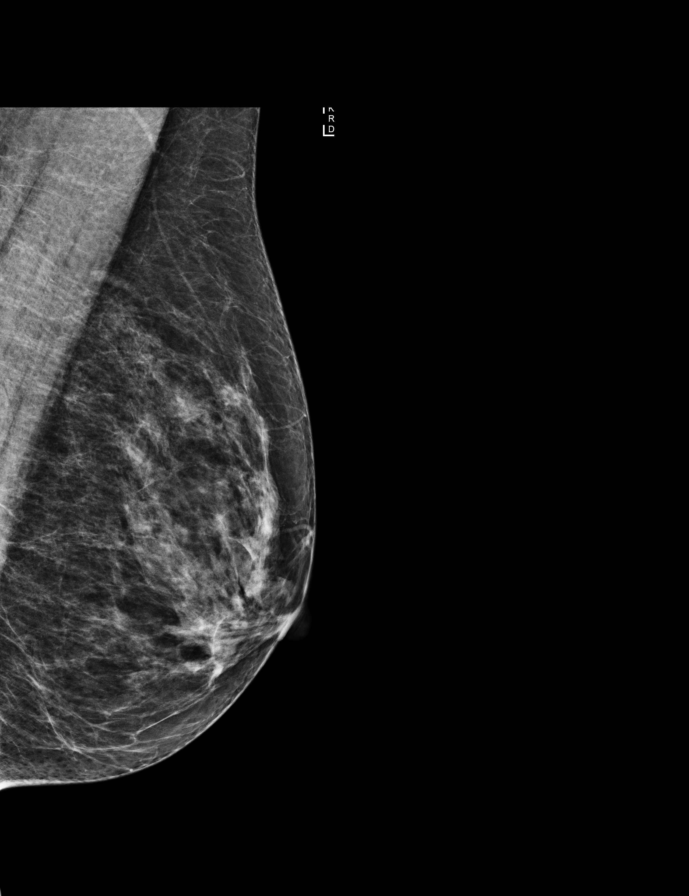

[R CC]
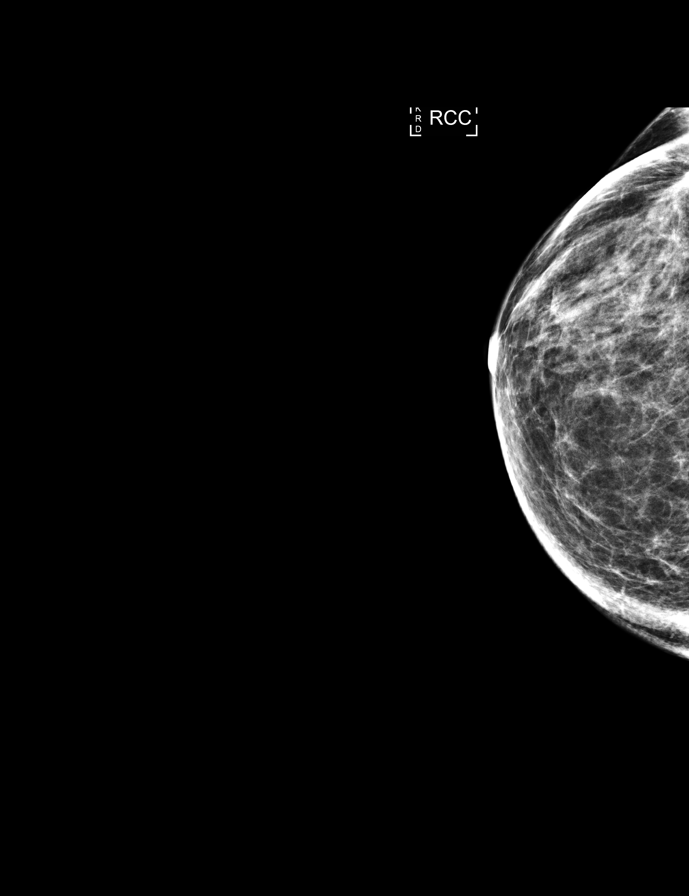

[R XCCL]
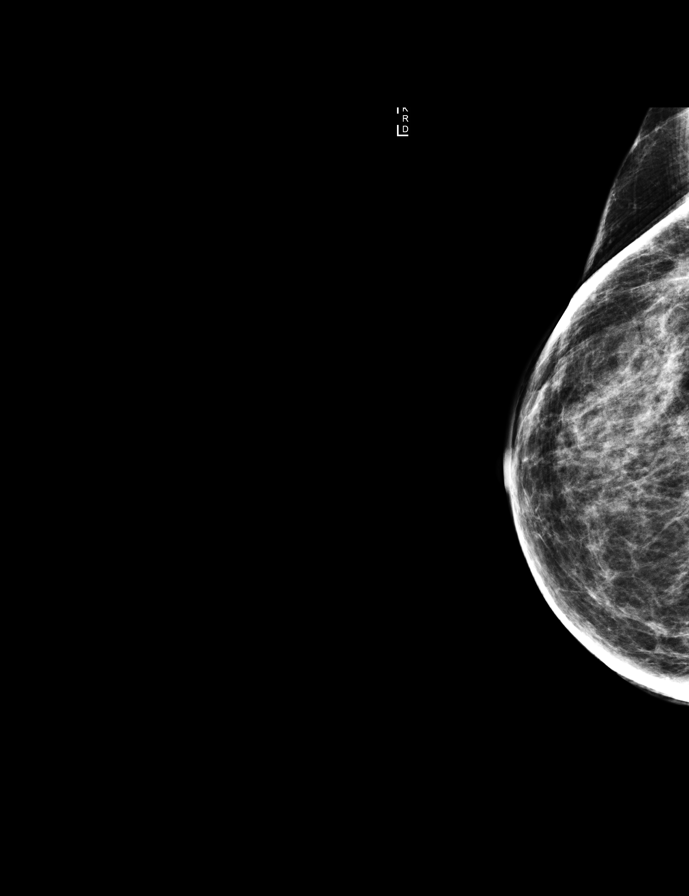

[L CC]
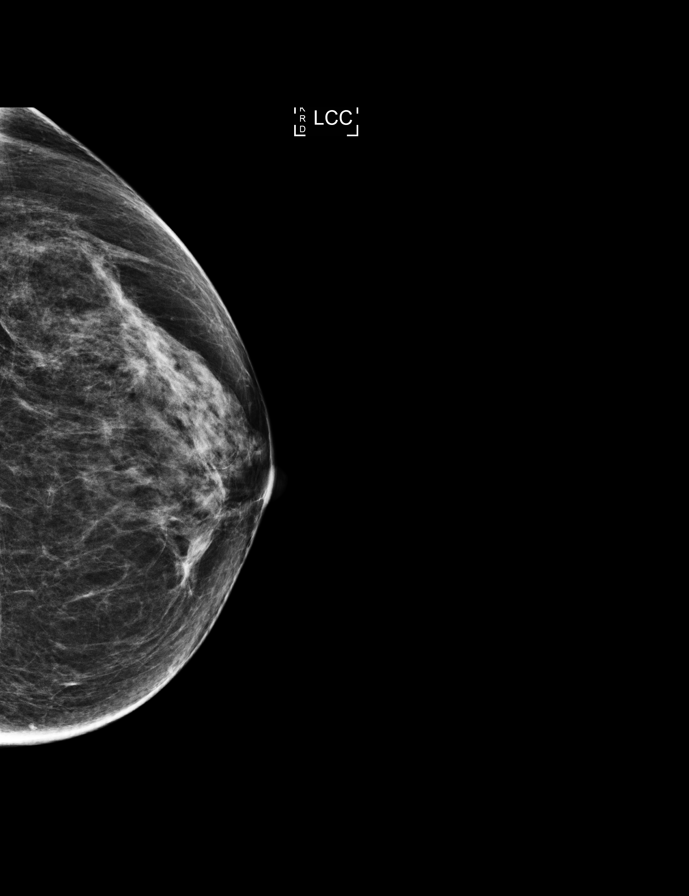

[R MLO (3 of 3)]
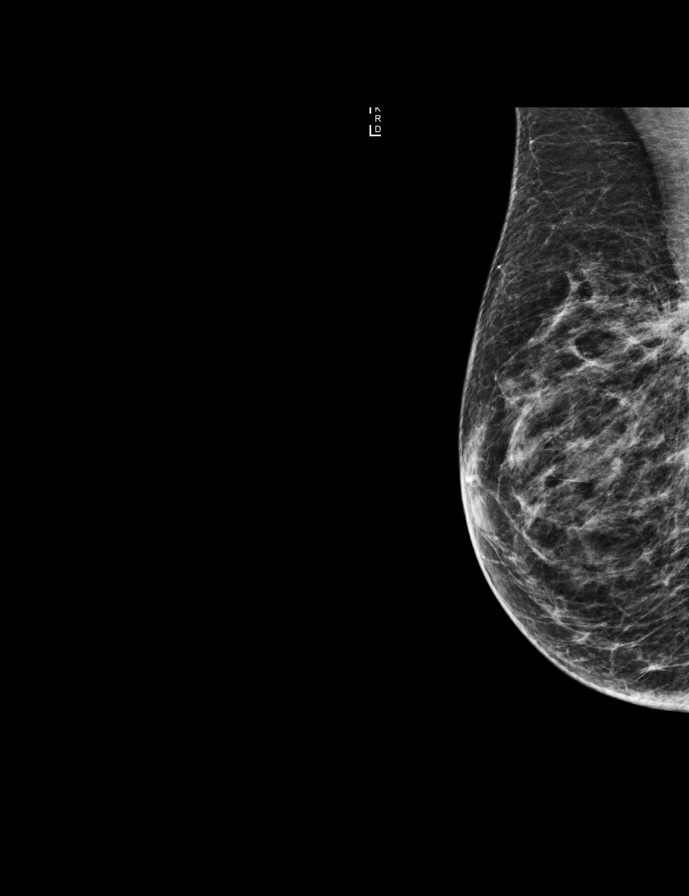

[9 of 37 positions shown; findings below may reference images not displayed]

ACR Breast Density Category c: The breast tissue is heterogeneously
dense, which may obscure small masses.
FINDINGS: No suspicious masses or calcifications are seen in either breast.
New lumpectomy changes are present in the upper-outer posterior
right breast. Spot compression magnification MLO view of the
lumpectomy site in the right breast was performed. There is no
mammographic evidence of locally recurrent malignancy.

Mammographic images were processed with CAD.
IMPRESSION: New lumpectomy site right breast. There is no mammographic evidence
of malignancy in either breast.

RECOMMENDATION:
Diagnostic mammogram is suggested in 1 year. (Code:B3-E-V08)

I have discussed the findings and recommendations with the patient.
Results were also provided in writing at the conclusion of the
visit. If applicable, a reminder letter will be sent to the patient
regarding the next appointment.

BI-RADS CATEGORY  2: Benign.

## 2018-05-27 ENCOUNTER — Other Ambulatory Visit: Payer: Self-pay | Admitting: Oncology

## 2018-05-30 ENCOUNTER — Other Ambulatory Visit: Payer: 59

## 2018-05-30 ENCOUNTER — Ambulatory Visit
Admission: RE | Admit: 2018-05-30 | Discharge: 2018-05-30 | Disposition: A | Discharge status: Home / Self Care | Payer: 59 | Source: Ambulatory Visit | Attending: Oncology | Admitting: Oncology

## 2018-05-30 DIAGNOSIS — C50411 Malignant neoplasm of upper-outer quadrant of right female breast: Secondary | ICD-10-CM | POA: Insufficient documentation

## 2018-05-31 ENCOUNTER — Ambulatory Visit: Payer: 59 | Admitting: Oncology

## 2018-06-05 NOTE — Progress Notes (Signed)
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 Oncotype DX 24, intermediate risk.  INTERVAL HISTORY: Patient returns to clinic today for routine 33-monthevaluation.  She has now discontinued anastrozole and letrozole and is taking tamoxifen.  She continues to have multiple complaints which she attributes to tamoxifen and states she is unsure if she wishes to continue treatment.  She otherwise feels well. She has no neurologic complaints. She denies any recent fevers or illnesses. She has a good appetite and denies weight loss. She has no chest pain or shortness of breath. She denies any nausea, vomiting, constipation, or diarrhea. She has no urinary complaints.  Patient offers no further specific complaints today.  REVIEW OF SYSTEMS:   Review of Systems  Constitutional: Positive for diaphoresis. Negative for fever, malaise/fatigue and weight loss.  Respiratory: Negative.  Negative for cough and shortness of breath.   Cardiovascular: Negative.  Negative for chest pain and leg swelling.  Gastrointestinal: Negative.  Negative for abdominal pain.  Genitourinary: Negative.  Negative for dysuria.  Musculoskeletal: Positive for joint pain. Negative for myalgias.  Skin: Negative.  Negative for rash.  Neurological: Positive for sensory change. Negative for focal weakness, weakness and headaches.  Psychiatric/Behavioral: Negative.  The patient is not nervous/anxious.     As per HPI. Otherwise, a complete review of systems is negative.  PAST MEDICAL HISTORY: Past Medical History:  Diagnosis Date  . Anemia   . Anxiety   . Arthritis    LOWER BACK  . [place]   right  breast INVASIVE MAMMARY CARCINOMA  . Cervical dysplasia   . DDD (degenerative disc disease), lumbar   . Personal history of radiation therapy     PAST SURGICAL HISTORY: Past Surgical History:  Procedure Laterality Date  . BREAST BIOPSY Right 06/11/2016   INVASIVE MAMMARY CARCINOMA  . BREAST LUMPECTOMY Right 07/08/2016   INVASIVE MAMMARY CARCINOMA  . CERVIX SURGERY    . PARTIAL MASTECTOMY WITH NEEDLE LOCALIZATION Right 07/08/2016   Procedure: PARTIAL MASTECTOMY WITH NEEDLE LOCALIZATION;  Surgeon: JLeonie Green MD;  Location: [facility] ORS;  Service: General;  Laterality: Right;  . SENTINEL NODE BIOPSY Right 07/08/2016   Procedure: SENTINEL NODE BIOPSY;  Surgeon: JLeonie Green MD;  Location: [facility] ORS;  Service: General;  Laterality: Right;  . WISDOM TOOTH EXTRACTION     AGE 36 OR 11    FAMILY HISTORY: Family History  Problem Relation Age of Onset  . Breast cancer Neg Hx     ADVANCED DIRECTIVES (Y/N):  N  HEALTH MAINTENANCE: Social History   Tobacco Use  . Smoking status: Former Smoker    Years: 2.00    Types: Cigarettes    Last attempt to quit: 07/01/1996    Years since quitting: 21.9  . Smokeless tobacco: Never Used  . Tobacco comment: ONLY WEEKEND SMOKER  Substance Use Topics  . Alcohol use: No  . Drug use: No     Colonoscopy:  PAP:  Bone density:  Lipid panel:  No Known Allergies  Current Outpatient Medications  Medication Sig Dispense Refill  . ALPRAZolam (XANAX) 0.25 MG tablet Take 1 tablet by mouth daily as needed.    .Marland KitchenHYDROcodone-acetaminophen (NORCO) 10-325 MG tablet Take  1 tablet by mouth daily as needed.    . tamoxifen (NOLVADEX) 20 MG tablet TAKE 1 TABLET(20 MG) BY MOUTH DAILY 30 tablet 2  . traMADol (ULTRAM) 50 MG tablet Take 50 mg by mouth every 6 (six) hours as needed.    . zolpidem (AMBIEN) 10 MG tablet Take 1 tablet by mouth at bedtime.     No current facility-administered medications for this visit.     OBJECTIVE: Vitals:    06/06/18 1518  BP: 137/62  Pulse: 69  Resp: 18  Temp: 98.9 F (37.2 C)     Body mass index is 22.49 kg/m.    ECOG FS:0 - Asymptomatic  General: Well-developed, well-nourished, no acute distress. Eyes: Pink conjunctiva, anicteric sclera. HEENT: Normocephalic, moist mucous membranes. Breast: Patient declined breast exam today. Lungs: Clear to auscultation bilaterally. Heart: Regular rate and rhythm. No rubs, murmurs, or gallops. Abdomen: Soft, nontender, nondistended. No organomegaly noted, normoactive bowel sounds. Musculoskeletal: No edema, cyanosis, or clubbing. Neuro: Alert, answering all questions appropriately. Cranial nerves grossly intact. Skin: No rashes or petechiae noted. Psych: Normal affect.  LAB RESULTS:  No results found for: NA, K, CL, CO2, GLUCOSE, BUN, CREATININE, CALCIUM, PROT, ALBUMIN, AST, ALT, ALKPHOS, BILITOT, GFRNONAA, GFRAA  Lab Results  Component Value Date   WBC 6.0 09/09/2016   HGB 9.3 (L) 09/09/2016   HCT 28.8 (L) 09/09/2016   MCV 66.4 (L) 09/09/2016   PLT 184 09/09/2016     STUDIES: Mm Diag Breast Tomo Bilateral  Result Date: 05/30/2018 CLINICAL DATA:  Personal history of right breast cancer status post lumpectomy 2018. EXAM: DIGITAL DIAGNOSTIC BILATERAL MAMMOGRAM WITH CAD AND TOMO COMPARISON:  Previous exam(s). ACR Breast Density Category b: There are scattered areas of fibroglandular density. FINDINGS: Cc and MLO views of bilateral breasts, spot tangential view of right breast are submitted. Stable postsurgical changes are identified in the right breast. No suspicious abnormalities identified bilaterally. Mammographic images were processed with CAD. IMPRESSION: Benign findings. RECOMMENDATION: Bilateral diagnostic mammogram in 1 year. I have discussed the findings and recommendations with the patient. Results were also provided in writing at the conclusion of the visit. If applicable, a reminder letter will be sent to the patient regarding the next  appointment. BI-RADS CATEGORY  2: Benign. Electronically Signed   By: Abelardo Diesel M.D.   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 Oncotype DX 24, intermediate risk.  PLAN:    1.  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  Because patient's Oncotype score was intermediate risk, she was offered chemotherapy which she declined.  Given the small size of her tumor, it was agreed upon that is likely not necessary. Patient completed adjuvant XRT.  Patient has now discontinued letrozole and anastrozole secondary to side effects.  She currently is taking tamoxifen, but reports side effects to this as well.  Patient states she will take it for now, but is considering discontinuing treatment altogether.  She expressed understanding that her risk of recurrence increases significantly by discontinuing treatment.  Her most recent mammogram on May 30, 2018 was reported as BI-RADS 2.  Return to clinic in 6 months for routine evaluation.   2. Osteopenia: Patient's bone mineral density on December 21, 2017 reported T score of -2.4.  This is essentially unchanged from 1 year prior  with a T score reportedly -2.3.  Continue calcium and vitamin D, and consider Fosamax in the future.    I spent a total of 30 minutes face-to-face with the patient of which greater than 50% of the visit was spent in counseling and coordination of care as detailed above.   Patient expressed understanding and was in agreement with this plan. She also understands that She can call clinic at any time with any questions, concerns, or complaints.    Cancer Staging Primary cancer of upper outer quadrant of right female breast [facility]) Staging form: Breast, AJCC 8th Edition - Clinical stage from 06/15/2016: Stage  IA (cT1b, cN0, cM0, G2, ER: Positive, PR: Positive, HER2: Negative) - Signed by Lloyd Huger, MD on 07/24/2016 - Pathologic stage from 07/24/2016: Stage IA (pT1b, pN0, cM0, G2, ER: Positive, PR: Positive, HER2: Negative, Oncotype DX score: 24) - Signed by Lloyd Huger, MD on 07/24/2016   Lloyd Huger, MD   06/08/2018 6:56 AM

## 2018-06-06 ENCOUNTER — Encounter: Payer: Self-pay | Admitting: Oncology

## 2018-06-06 ENCOUNTER — Inpatient Hospital Stay: Payer: 59 | Attending: Oncology | Admitting: Oncology

## 2018-06-06 VITALS — BP 137/62 | HR 69 | Temp 98.9°F | Resp 18 | Wt 131.0 lb

## 2018-06-06 DIAGNOSIS — Z170 Estrogen receptor positive status [ER+]: Secondary | ICD-10-CM | POA: Diagnosis not present

## 2018-06-06 DIAGNOSIS — Z79899 Other long term (current) drug therapy: Secondary | ICD-10-CM | POA: Insufficient documentation

## 2018-06-06 DIAGNOSIS — C50411 Malignant neoplasm of upper-outer quadrant of right female breast: Secondary | ICD-10-CM | POA: Insufficient documentation

## 2018-06-06 DIAGNOSIS — M8580 Other specified disorders of bone density and structure, unspecified site: Secondary | ICD-10-CM | POA: Diagnosis not present

## 2018-06-06 DIAGNOSIS — Z87891 Personal history of nicotine dependence: Secondary | ICD-10-CM | POA: Diagnosis not present

## 2018-08-27 ENCOUNTER — Other Ambulatory Visit: Payer: Self-pay | Admitting: Oncology

## 2018-11-15 ENCOUNTER — Ambulatory Visit: Payer: 59 | Admitting: Radiation Oncology

## 2018-11-24 ENCOUNTER — Other Ambulatory Visit: Payer: Self-pay | Admitting: Oncology

## 2018-12-21 ENCOUNTER — Ambulatory Visit: Payer: 59 | Admitting: Oncology

## 2018-12-21 NOTE — Progress Notes (Deleted)
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 Oncotype DX 24, intermediate risk.  INTERVAL HISTORY: Patient returns to clinic today for routine 46-monthevaluation.  She has now discontinued anastrozole and letrozole and is taking tamoxifen.  She continues to have multiple complaints which she attributes to tamoxifen and states she is unsure if she wishes to continue treatment.  She otherwise feels well. She has no neurologic complaints. She denies any recent fevers or illnesses. She has a good appetite and denies weight loss. She has no chest pain or shortness of breath. She denies any nausea, vomiting, constipation, or diarrhea. She has no urinary complaints.  Patient offers no further specific complaints today.  REVIEW OF SYSTEMS:   Review of Systems  Constitutional: Positive for diaphoresis. Negative for fever, malaise/fatigue and weight loss.  Respiratory: Negative.  Negative for cough and shortness of breath.   Cardiovascular: Negative.  Negative for chest pain and leg swelling.  Gastrointestinal: Negative.  Negative for abdominal pain.  Genitourinary: Negative.  Negative for dysuria.  Musculoskeletal: Positive for joint pain. Negative for myalgias.  Skin: Negative.  Negative for rash.  Neurological: Positive for sensory change. Negative for focal weakness, weakness and headaches.  Psychiatric/Behavioral: Negative.  The patient is not nervous/anxious.     As per HPI. Otherwise, a complete review of systems is negative.  PAST MEDICAL HISTORY: Past Medical History:  Diagnosis Date   Anemia    Anxiety    Arthritis    LOWER BACK   [place]   [place]  breast INVASIVE MAMMARY CARCINOMA   Cervical dysplasia    DDD (degenerative disc disease), lumbar    Personal history of radiation therapy     PAST SURGICAL HISTORY: Past Surgical History:  Procedure Laterality Date   BREAST BIOPSY Right 06/11/2016   INVASIVE MAMMARY CARCINOMA   BREAST LUMPECTOMY Right 07/08/2016   INVASIVE MAMMARY CARCINOMA   CERVIX SURGERY     PARTIAL MASTECTOMY WITH NEEDLE LOCALIZATION Right 07/08/2016   Procedure: PARTIAL MASTECTOMY WITH NEEDLE LOCALIZATION;  Surgeon: JLeonie Green MD;  Location: [facility] ORS;  Service: General;  Laterality: Right;   SENTINEL NODE BIOPSY Right 07/08/2016   Procedure: SENTINEL NODE BIOPSY;  Surgeon: JLeonie Green MD;  Location: [facility] ORS;  Service: General;  Laterality: Right;   WISDOM TOOTH EXTRACTION     AGE [age]OR 11    FAMILY HISTORY: Family History  Problem Relation Age of Onset   Breast cancer Neg Hx     ADVANCED DIRECTIVES (Y/N):  N  HEALTH MAINTENANCE: Social History   Tobacco Use   Smoking status: Former Smoker    Years: 2.00    Types: Cigarettes    Quit date: 07/01/1996    Years since quitting: 22.4   Smokeless tobacco: Never Used   Tobacco comment: ONLY WEEKEND SMOKER  Substance Use Topics   Alcohol use: No   Drug use: No     Colonoscopy:  PAP:  Bone density:  Lipid panel:  No Known Allergies  Current Outpatient Medications  Medication Sig Dispense Refill   ALPRAZolam (XANAX) 0.25 MG tablet Take 1 tablet by mouth daily as needed.     HYDROcodone-acetaminophen (NORCO) 10-325 MG tablet Take 1 tablet  by mouth daily as needed.     tamoxifen (NOLVADEX) 20 MG tablet TAKE 1 TABLET(20 MG) BY MOUTH DAILY 30 tablet 2   traMADol (ULTRAM) 50 MG tablet Take 50 mg by mouth every 6 (six) hours as needed.     zolpidem (AMBIEN) 10 MG tablet Take 1 tablet by mouth at bedtime.     No current facility-administered medications for this visit.     OBJECTIVE: There were no vitals filed  for this visit.   There is no height or weight on file to calculate BMI.    ECOG FS:0 - Asymptomatic  General: Well-developed, well-nourished, no acute distress. Eyes: Pink conjunctiva, anicteric sclera. HEENT: Normocephalic, moist mucous membranes. Breast: Patient declined breast exam today. Lungs: Clear to auscultation bilaterally. Heart: Regular rate and rhythm. No rubs, murmurs, or gallops. Abdomen: Soft, nontender, nondistended. No organomegaly noted, normoactive bowel sounds. Musculoskeletal: No edema, cyanosis, or clubbing. Neuro: Alert, answering all questions appropriately. Cranial nerves grossly intact. Skin: No rashes or petechiae noted. Psych: Normal affect.  LAB RESULTS:  No results found for: NA, K, CL, CO2, GLUCOSE, BUN, CREATININE, CALCIUM, PROT, ALBUMIN, AST, ALT, ALKPHOS, BILITOT, GFRNONAA, GFRAA  Lab Results  Component Value Date   WBC 6.0 09/09/2016   HGB 9.3 (L) 09/09/2016   HCT 28.8 (L) 09/09/2016   MCV 66.4 (L) 09/09/2016   PLT 184 09/09/2016     STUDIES: No results found.  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 Oncotype DX 24, intermediate risk.  PLAN:    1.  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  Because patient's Oncotype score was intermediate risk, she was offered chemotherapy which she declined.  Given the small size of her tumor, it was agreed upon that is likely not necessary. Patient completed adjuvant XRT.  Patient has now discontinued letrozole and anastrozole secondary to side effects.  She currently is taking tamoxifen, but reports side effects to this as well.  Patient states she will take it for now, but is considering discontinuing treatment altogether.  She expressed understanding that her risk of recurrence increases  significantly by discontinuing treatment.  Her most recent mammogram on May 30, 2018 was reported as BI-RADS 2.  Return to clinic in 6 months for routine evaluation.   2. Osteopenia: Patient's bone mineral density on December 21, 2017 reported T score of -2.4.  This is essentially unchanged from 1 year prior with a T score reportedly -2.3.  Continue calcium and vitamin D, and consider Fosamax in the future.    I spent a total of 30 minutes face-to-face with the patient of which greater than 50% of the visit was spent in counseling and coordination of care as detailed above.   Patient expressed understanding and was in agreement with this plan. She also understands that She can call clinic at any time with any questions, concerns, or complaints.    Cancer Staging Primary cancer of upper outer quadrant of right female breast [facility]) Staging form: Breast, AJCC 8th Edition - Clinical stage from 06/15/2016: Stage IA (cT1b, cN0, cM0, G2, ER: Positive, PR: Positive, HER2: Negative) - Signed by Lloyd Huger, MD on 07/24/2016 - Pathologic stage from 07/24/2016: Stage IA (pT1b, pN0, cM0, G2, ER: Positive, PR: Positive, HER2: Negative, Oncotype DX score: 24) - Signed by Lloyd Huger, MD on 07/24/2016  Lloyd Huger, MD   12/21/2018 11:52 PM

## 2018-12-25 ENCOUNTER — Other Ambulatory Visit: Payer: 59

## 2018-12-26 ENCOUNTER — Other Ambulatory Visit: Payer: Self-pay

## 2018-12-26 ENCOUNTER — Encounter: Payer: Self-pay | Admitting: Radiation Oncology

## 2018-12-26 ENCOUNTER — Ambulatory Visit
Admission: RE | Admit: 2018-12-26 | Discharge: 2018-12-26 | Disposition: A | Discharge status: Home / Self Care | Payer: 59 | Source: Ambulatory Visit | Attending: Radiation Oncology | Admitting: Radiation Oncology

## 2018-12-26 VITALS — BP 145/48 | HR 66 | Temp 99.0°F | Resp 16 | Wt 133.5 lb

## 2018-12-26 DIAGNOSIS — Z170 Estrogen receptor positive status [ER+]: Secondary | ICD-10-CM | POA: Insufficient documentation

## 2018-12-26 DIAGNOSIS — Z923 Personal history of irradiation: Secondary | ICD-10-CM | POA: Insufficient documentation

## 2018-12-26 DIAGNOSIS — C50411 Malignant neoplasm of upper-outer quadrant of right female breast: Secondary | ICD-10-CM

## 2018-12-26 DIAGNOSIS — Z79810 Long term (current) use of selective estrogen receptor modulators (SERMs): Secondary | ICD-10-CM | POA: Diagnosis not present

## 2018-12-26 NOTE — Progress Notes (Signed)
Radiation Oncology Follow up Note  Name: Erin Stevens   Date:   12/26/2018 MRN:  [id number] DOB: 1960-05-19    This 58 y.o. female presents to the clinic today for 2-year follow-up status post whole breast radiation to her right breast for stage I ER PR positive invasive mammary carcinoma.  REFERRING PROVIDER: [name], MD  HPI: Patient is a 58 year old female now seen now 2 years having completed whole breast radiation to her right breast for stage I ER PR positive invasive mammary carcinoma.  Seen today in routine follow-up she is doing well.  She specifically denies breast tenderness cough or bone pain.  Her last mammogram was back in February.  Which I have have reviewed was a BI-RADS 2 benign.  She currently is on tamoxifen tolerating that well without side effect.  COMPLICATIONS OF TREATMENT: none  FOLLOW UP COMPLIANCE: keeps appointments   PHYSICAL EXAM:  BP (!) 145/48 (BP Location: Left Arm, Patient Position: Sitting)   Pulse 66   Temp 99 F (37.2 C) (Tympanic)   Resp 16   Wt 133 lb 8 oz (60.6 kg)   BMI 22.92 kg/m  Lungs are clear to A&P cardiac examination essentially unremarkable with regular rate and rhythm. No dominant mass or nodularity is noted in either breast in 2 positions examined. Incision is well-healed. No axillary or supraclavicular adenopathy is appreciated. Cosmetic result is excellent.  Well-developed well-nourished patient in NAD. HEENT reveals PERLA, EOMI, discs not visualized.  Oral cavity is clear. No oral mucosal lesions are identified. Neck is clear without evidence of cervical or supraclavicular adenopathy. Lungs are clear to A&P. Cardiac examination is essentially unremarkable with regular rate and rhythm without murmur rub or thrill. Abdomen is benign with no organomegaly or masses noted. Motor sensory and DTR levels are equal and symmetric in the upper and lower extremities. Cranial nerves II through XII are grossly intact. Proprioception is intact.  No peripheral adenopathy or edema is identified. No motor or sensory levels are noted. Crude visual fields are within normal range.  RADIOLOGY RESULTS: Mammograms reviewed compatible with above-stated findings  PLAN: Present time she is now 2 years out with no evidence of disease.  I am pleased with her overall progress.  I have asked to see her back in 1 year for follow-up.  She is already scheduled for follow-up mammograms this February.  Patient continues on tamoxifen without side effect.  Patient knows to call with any concerns.  I would like to take this opportunity to thank you for allowing me to participate in the care of your patient.Noreene Filbert, MD

## 2018-12-28 ENCOUNTER — Inpatient Hospital Stay: Payer: 59 | Admitting: Oncology

## 2019-01-01 ENCOUNTER — Other Ambulatory Visit: Payer: 59

## 2019-01-04 DIAGNOSIS — G8929 Other chronic pain: Secondary | ICD-10-CM | POA: Insufficient documentation

## 2019-01-04 DIAGNOSIS — M5442 Lumbago with sciatica, left side: Secondary | ICD-10-CM | POA: Insufficient documentation

## 2019-01-31 ENCOUNTER — Ambulatory Visit
Admission: RE | Admit: 2019-01-31 | Discharge: 2019-01-31 | Disposition: A | Discharge status: Home / Self Care | Payer: 59 | Source: Ambulatory Visit | Attending: Oncology | Admitting: Oncology

## 2019-01-31 DIAGNOSIS — C50411 Malignant neoplasm of upper-outer quadrant of right female breast: Secondary | ICD-10-CM | POA: Diagnosis not present

## 2019-02-04 NOTE — Progress Notes (Signed)
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 Oncotype DX 24, intermediate risk.  INTERVAL HISTORY: Patient returns to clinic today for routine 32-monthevaluation.  She continues to take tamoxifen, but complains of causes her joint pain.  She otherwise feels well. She has no neurologic complaints. She denies any recent fevers or illnesses. She has a good appetite and denies weight loss.  She denies any chest pain, shortness of breath, cough, or hemoptysis.  She denies any nausea, vomiting, constipation, or diarrhea. She has no urinary complaints.  Patient offers no further specific complaints today.  REVIEW OF SYSTEMS:   Review of Systems  Constitutional: Negative.  Negative for diaphoresis, fever, malaise/fatigue and weight loss.  Respiratory: Negative.  Negative for cough and shortness of breath.   Cardiovascular: Negative.  Negative for chest pain and leg swelling.  Gastrointestinal: Negative.  Negative for abdominal pain.  Genitourinary: Negative.  Negative for dysuria.  Musculoskeletal: Positive for joint pain. Negative for myalgias.  Skin: Negative.  Negative for rash.  Neurological: Negative.  Negative for sensory change, focal weakness, weakness and headaches.  Psychiatric/Behavioral: Negative.  The patient is not nervous/anxious.     As per HPI. Otherwise, a complete review of systems is negative.  PAST MEDICAL HISTORY: Past Medical History:  Diagnosis Date  . Anemia   . Anxiety   . Arthritis    LOWER BACK  . [place]   right breast INVASIVE MAMMARY CARCINOMA  . Cervical dysplasia   . DDD (degenerative disc disease), lumbar   . Personal  history of radiation therapy     PAST SURGICAL HISTORY: Past Surgical History:  Procedure Laterality Date  . BREAST BIOPSY Right 06/11/2016   INVASIVE MAMMARY CARCINOMA  . BREAST LUMPECTOMY Right 07/08/2016   INVASIVE MAMMARY CARCINOMA  . CERVIX SURGERY    . PARTIAL MASTECTOMY WITH NEEDLE LOCALIZATION Right 07/08/2016   Procedure: PARTIAL MASTECTOMY WITH NEEDLE LOCALIZATION;  Surgeon: JLeonie Green MD;  Location: [facility] ORS;  Service: General;  Laterality: Right;  . SENTINEL NODE BIOPSY Right 07/08/2016   Procedure: SENTINEL NODE BIOPSY;  Surgeon: JLeonie Green MD;  Location: [facility] ORS;  Service: General;  Laterality: Right;  . WISDOM TOOTH EXTRACTION     AGE 44 OR 11    FAMILY HISTORY: Family History  Problem Relation Age of Onset  . Breast cancer Neg Hx     ADVANCED DIRECTIVES (Y/N):  N  HEALTH MAINTENANCE: Social History   Tobacco Use  . Smoking status: Former Smoker    Years: 2.00    Types: Cigarettes    Quit date: 07/01/1996    Years since quitting: 22.6  . Smokeless tobacco: Never Used  . Tobacco comment: ONLY WEEKEND SMOKER  Substance Use Topics  . Alcohol use: No  . Drug use: No     Colonoscopy:  PAP:  Bone density:  Lipid panel:  No Known Allergies  Current Outpatient Medications  Medication Sig Dispense Refill  . ALPRAZolam (XANAX) 0.25 MG tablet Take 1 tablet by mouth daily as needed.    .Marland KitchenHYDROcodone-acetaminophen (NORCO) 10-325 MG tablet Take 1 tablet by mouth daily as needed.    . tamoxifen (NOLVADEX) 20 MG tablet TAKE 1 TABLET(20 MG)  BY MOUTH DAILY 30 tablet 2  . traMADol (ULTRAM) 50 MG tablet Take 50 mg by mouth every 6 (six) hours as needed.    . zolpidem (AMBIEN) 10 MG tablet Take 1 tablet by mouth at bedtime.    Marland Kitchen alendronate (FOSAMAX) 70 MG tablet Take 1 tablet (70 mg total) by mouth once a week. Take with a full glass of water on an empty stomach. 12 tablet 0   No current facility-administered medications for this visit.      OBJECTIVE: Vitals:   02/08/19 1439  BP: (!) 147/57  Pulse: 65  Resp: 16  Temp: 97.9 F (36.6 C)  SpO2: 100%     Body mass index is 22.92 kg/m.    ECOG FS:0 - Asymptomatic  General: Well-developed, well-nourished, no acute distress. Eyes: Pink conjunctiva, anicteric sclera. HEENT: Normocephalic, moist mucous membranes. Breast: Patient declined exam today. Lungs: Clear to auscultation bilaterally. Heart: Regular rate and rhythm. No rubs, murmurs, or gallops. Abdomen: Soft, nontender, nondistended. No organomegaly noted, normoactive bowel sounds. Musculoskeletal: No edema, cyanosis, or clubbing. Neuro: Alert, answering all questions appropriately. Cranial nerves grossly intact. Skin: No rashes or petechiae noted. Psych: Normal affect.  LAB RESULTS:  No results found for: NA, K, CL, CO2, GLUCOSE, BUN, CREATININE, CALCIUM, PROT, ALBUMIN, AST, ALT, ALKPHOS, BILITOT, GFRNONAA, GFRAA  Lab Results  Component Value Date   WBC 6.0 09/09/2016   HGB 9.3 (L) 09/09/2016   HCT 28.8 (L) 09/09/2016   MCV 66.4 (L) 09/09/2016   PLT 184 09/09/2016     STUDIES: Dg Bone Density  Result Date: 01/31/2019 EXAM: DUAL X-RAY ABSORPTIOMETRY (DXA) FOR BONE MINERAL DENSITY IMPRESSION: Technologist: MTB Your patient Erin Stevens completed a BMD test on 01/31/2019 using the [employer] (analysis version: 14.10) manufactured by [employer]. The following summarizes the results of our evaluation. PATIENT BIOGRAPHICAL: Name: Erin Stevens Patient ID: [id number] Birth Date: 09/15/1960 Height:     64.0 in. Gender: Female Exam Date: 01/31/2019 Weight:     132.0 lbs. Indications: Postmenopausal, Low Calcium Intake, Breast CA, Caucasian Fractures: Treatments: letrozole, Tamoxifen ASSESSMENT: The BMD measured at Forearm Radius 33% is 0.605 g/cm2 with a T-score of -3.1. This patient is considered OSTEOPOROTIC according to [facility]) criteria. The scan quality is good. Site Region  Measured Measured WHO Young Adult BMD Date       Age      Classification T-score AP Spine L1-L4 01/31/2019 60.8 Osteopenia -1.8 0.971 g/cm2 AP Spine L1-L4 12/21/2017 59.6 Osteopenia -2.1 0.934 g/cm2 AP Spine L1-L4 11/23/2016 58.6 Osteopenia -2.3 0.913 g/cm2 DualFemur Neck Right 01/31/2019 60.8 Osteopenia -2.4 0.704 g/cm2 DualFemur Neck Right 12/21/2017 59.6 Osteopenia -2.4 0.710 g/cm2 DualFemur Neck Right 11/23/2016 58.6 Osteopenia -2.3 0.713 g/cm2 DualFemur Total Mean 01/31/2019 60.8 Osteopenia -1.6 0.807 g/cm2 DualFemur Total Mean 12/21/2017 59.6 Osteopenia -1.7 0.795 g/cm2 DualFemur Total Mean 11/23/2016 58.6 Osteopenia -1.9 0.773 g/cm2 Left Forearm Radius 33% 01/31/2019 60.8 Osteoporosis -3.1 0.605 g/cm2 Left Forearm Radius 33% 12/21/2017 59.6 Osteoporosis -2.9 0.623 g/cm2 World Health Organization [facility]) criteria for post-menopausal, Caucasian Women: Normal:       T-score at or above -1 SD Osteopenia:   T-score between -1 and -2.5 SD Osteoporosis: T-score at or below -2.5 SD RECOMMENDATIONS: 1. All patients should optimize calcium and vitamin D intake. 2. Consider FDA-approved medical therapies in postmenopausal women and men aged 81 years and older, based on the following: a. A hip or vertebral(clinical or morphometric) fracture b. T-score < -2.5 at the femoral  neck or spine after appropriate evaluation to exclude secondary causes c. Low bone mass (T-score between -1.0 and -2.5 at the femoral neck or spine) and a 10-year probability of a hip fracture > 3% or a 10-year probability of a major osteoporosis-related fracture > 20% based on the [place]-adapted WHO algorithm d. Clinician judgment and/or patient preferences may indicate treatment for people with 10-year fracture probabilities above or below these levels FOLLOW-UP: People with diagnosed cases of osteoporosis or at high risk for fracture should have regular bone mineral density tests. For patients eligible for Medicare, routine testing is allowed once every 2  years. The testing frequency can be increased to one year for patients who have rapidly progressing disease, those who are receiving or discontinuing medical therapy to restore bone mass, or have additional risk factors. I have reviewed this report, and agree with the above findings. Mark A. Thornton Papas, M.D. [facility] Radiology Electronically Signed   By: Lavonia Dana M.D.   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 Oncotype DX 24, intermediate risk.  PLAN:    1.  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  Because patient's Oncotype score was intermediate risk, she was offered chemotherapy which she declined.  Given the small size of her tumor, it was agreed upon that is likely not necessary. Patient completed adjuvant XRT.  Patient has now discontinued letrozole and anastrozole secondary to side effects.  Continue tamoxifen for a total of 5 years of treatment completing in approximately July 2023.  Given patient's intermediate risk, she may benefit from extended treatment.  Her most recent mammogram on May 30, 2018 was reported as BI-RADS 2.  Repeat mammogram in February 2021.  Patient will have video assisted telemedicine visit for further evaluation in 6 months.  2.  Osteoporosis: Patient's most recent bone mineral density on January 31, 2019 revealed a T score of -3.1.  This is significantly worse than 1 year prior when the T score was reported -2.4.  She has been instructed to continue calcium and vitamin D supplementation.  Patient was also given a prescription for Fosamax today.  Repeat bone mineral density in October 2021.   Patient expressed understanding and was in agreement with this plan. She also understands that She can call clinic at any time with any  questions, concerns, or complaints.    Cancer Staging Primary cancer of upper outer quadrant of right female breast [facility]) Staging form: Breast, AJCC 8th Edition - Clinical stage from 06/15/2016: Stage IA (cT1b, cN0, cM0, G2, ER: Positive, PR: Positive, HER2: Negative) - Signed by Lloyd Huger, MD on 07/24/2016 - Pathologic stage from 07/24/2016: Stage IA (pT1b, pN0, cM0, G2, ER: Positive, PR: Positive, HER2: Negative, Oncotype DX score: 24) - Signed by Lloyd Huger, MD on 07/24/2016   Lloyd Huger, MD   02/08/2019 6:09 PM

## 2019-02-07 NOTE — Progress Notes (Signed)
Called patient no answer.

## 2019-02-08 ENCOUNTER — Inpatient Hospital Stay: Payer: 59 | Attending: Oncology | Admitting: Oncology

## 2019-02-08 ENCOUNTER — Other Ambulatory Visit: Payer: Self-pay

## 2019-02-08 VITALS — BP 147/57 | HR 65 | Temp 97.9°F | Resp 16 | Wt 133.5 lb

## 2019-02-08 DIAGNOSIS — Z862 Personal history of diseases of the blood and blood-forming organs and certain disorders involving the immune mechanism: Secondary | ICD-10-CM | POA: Insufficient documentation

## 2019-02-08 DIAGNOSIS — Z79899 Other long term (current) drug therapy: Secondary | ICD-10-CM | POA: Diagnosis not present

## 2019-02-08 DIAGNOSIS — M792 Neuralgia and neuritis, unspecified: Secondary | ICD-10-CM | POA: Insufficient documentation

## 2019-02-08 DIAGNOSIS — Z923 Personal history of irradiation: Secondary | ICD-10-CM | POA: Insufficient documentation

## 2019-02-08 DIAGNOSIS — G43909 Migraine, unspecified, not intractable, without status migrainosus: Secondary | ICD-10-CM | POA: Insufficient documentation

## 2019-02-08 DIAGNOSIS — Z170 Estrogen receptor positive status [ER+]: Secondary | ICD-10-CM | POA: Diagnosis not present

## 2019-02-08 DIAGNOSIS — Z79811 Long term (current) use of aromatase inhibitors: Secondary | ICD-10-CM | POA: Diagnosis not present

## 2019-02-08 DIAGNOSIS — F419 Anxiety disorder, unspecified: Secondary | ICD-10-CM | POA: Insufficient documentation

## 2019-02-08 DIAGNOSIS — Z87891 Personal history of nicotine dependence: Secondary | ICD-10-CM | POA: Diagnosis not present

## 2019-02-08 DIAGNOSIS — N926 Irregular menstruation, unspecified: Secondary | ICD-10-CM | POA: Insufficient documentation

## 2019-02-08 DIAGNOSIS — R102 Pelvic and perineal pain: Secondary | ICD-10-CM | POA: Insufficient documentation

## 2019-02-08 DIAGNOSIS — C50411 Malignant neoplasm of upper-outer quadrant of right female breast: Secondary | ICD-10-CM | POA: Diagnosis not present

## 2019-02-08 DIAGNOSIS — M810 Age-related osteoporosis without current pathological fracture: Secondary | ICD-10-CM | POA: Diagnosis not present

## 2019-02-08 MED ORDER — ALENDRONATE SODIUM 70 MG PO TABS: 70.0000 mg | ORAL_TABLET | ORAL | 0 refills | Status: DC

## 2019-02-08 NOTE — Progress Notes (Signed)
Patient here for follow up. No new questions or concerns.

## 2019-02-23 ENCOUNTER — Other Ambulatory Visit: Payer: Self-pay | Admitting: Oncology

## 2019-05-23 ENCOUNTER — Other Ambulatory Visit: Payer: Self-pay | Admitting: Oncology

## 2019-06-05 ENCOUNTER — Other Ambulatory Visit: Payer: Self-pay

## 2019-06-05 ENCOUNTER — Ambulatory Visit
Admission: RE | Admit: 2019-06-05 | Discharge: 2019-06-05 | Disposition: A | Discharge status: Home / Self Care | Payer: 59 | Source: Ambulatory Visit | Attending: Oncology | Admitting: Oncology

## 2019-06-05 ENCOUNTER — Other Ambulatory Visit: Payer: Self-pay | Admitting: Emergency Medicine

## 2019-06-05 ENCOUNTER — Other Ambulatory Visit: Payer: Self-pay | Admitting: Oncology

## 2019-06-05 DIAGNOSIS — C50411 Malignant neoplasm of upper-outer quadrant of right female breast: Secondary | ICD-10-CM | POA: Diagnosis present

## 2019-06-05 NOTE — Progress Notes (Signed)
[id number]

## 2019-08-10 NOTE — Progress Notes (Signed)
[place]  [phone] Fax:(336) [phone]  ID: SIDDALEE VANDERHEIDEN OB: 01-Jan-1961  MR#: [id number]  XLK#:[id number]  Patient Care Team: [name], MD as PCP - General (Family Medicine) Lloyd Huger, MD as Consulting Physician (Oncology)   I connected with Alanda Amass on 08/14/19 at  2:30 PM EDT by video enabled telemedicine visit and verified that I am speaking with the correct person using two identifiers.   I discussed the limitations, risks, security and privacy concerns of performing an evaluation and management service by telemedicine and the availability of in-person appointments. I also discussed with the patient that there may be a patient responsible charge related to this service. The patient expressed understanding and agreed to proceed.   Other persons participating in the visit and their role in the encounter: Patient, MD.  Patient's location: Car. Provider's location: Clinic.  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 Oncotype DX 24, intermediate risk.  INTERVAL HISTORY: Patient agreed to video assisted telemedicine visit for routine 61-monthevaluation.  She continues to take tamoxifen without significant side effects. She has no neurologic complaints. She denies any recent fevers or illnesses. She has a good appetite and denies weight loss.  She denies any chest pain, shortness of breath, cough, or hemoptysis.  She denies any nausea, vomiting, constipation, or diarrhea. She has no urinary complaints.  Patient feels at her baseline offers no specific complaints today.  REVIEW OF SYSTEMS:   Review of Systems  Constitutional: Negative.  Negative for diaphoresis, fever, malaise/fatigue and weight loss.  Respiratory: Negative.  Negative for cough and shortness of breath.   Cardiovascular: Negative.  Negative for chest pain and leg swelling.    Gastrointestinal: Negative.  Negative for abdominal pain.  Genitourinary: Negative.  Negative for dysuria.  Musculoskeletal: Positive for joint pain. Negative for myalgias.  Skin: Negative.  Negative for rash.  Neurological: Negative.  Negative for sensory change, focal weakness, weakness and headaches.  Psychiatric/Behavioral: Negative.  The patient is not nervous/anxious.     As per HPI. Otherwise, a complete review of systems is negative.  PAST MEDICAL HISTORY: Past Medical History:  Diagnosis Date  . Anemia   . Anxiety   . Arthritis    LOWER BACK  . [place]   right breast INVASIVE MAMMARY CARCINOMA  . Cervical dysplasia   . DDD (degenerative disc disease), lumbar   . Personal history of radiation therapy 2018   right breast ca    PAST SURGICAL HISTORY: Past Surgical History:  Procedure Laterality Date  . BREAST BIOPSY Right 06/11/2016   INVASIVE MAMMARY CARCINOMA  . BREAST LUMPECTOMY Right 07/08/2016   INVASIVE MAMMARY CARCINOMA, clear margins, negative LN  . CERVIX SURGERY    . PARTIAL MASTECTOMY WITH NEEDLE LOCALIZATION Right 07/08/2016   Procedure: PARTIAL MASTECTOMY WITH NEEDLE LOCALIZATION;  Surgeon: JLeonie Green MD;  Location: [facility] ORS;  Service: General;  Laterality: Right;  . SENTINEL NODE BIOPSY Right 07/08/2016   Procedure: SENTINEL NODE BIOPSY;  Surgeon: JLeonie Green MD;  Location: [facility] ORS;  Service: General;  Laterality: Right;  . WISDOM TOOTH EXTRACTION     AGE 31 OR 11    FAMILY HISTORY: Family History  Problem Relation Age of Onset  . Breast cancer Neg Hx     ADVANCED DIRECTIVES (Y/N):  N  HEALTH MAINTENANCE: Social History   Tobacco Use  . Smoking status: Former  Smoker    Years: 2.00    Types: Cigarettes    Quit date: 07/01/1996    Years since quitting: 23.1  . Smokeless tobacco: Never Used  . Tobacco comment: ONLY WEEKEND SMOKER  Substance Use Topics  . Alcohol use: No  . Drug use: No      Colonoscopy:  PAP:  Bone density:  Lipid panel:  No Known Allergies  Current Outpatient Medications  Medication Sig Dispense Refill  . ALPRAZolam (XANAX) 0.25 MG tablet Take 1 tablet by mouth daily as needed.    Marland Kitchen HYDROcodone-acetaminophen (NORCO) 10-325 MG tablet Take 1 tablet by mouth daily as needed.    . tamoxifen (NOLVADEX) 20 MG tablet TAKE 1 TABLET(20 MG) BY MOUTH DAILY 90 tablet 3  . traMADol (ULTRAM) 50 MG tablet Take 50 mg by mouth every 6 (six) hours as needed.    . zolpidem (AMBIEN) 10 MG tablet Take 1 tablet by mouth at bedtime.    Marland Kitchen alendronate (FOSAMAX) 70 MG tablet Take 1 tablet (70 mg total) by mouth once a week. Take with a full glass of water on an empty stomach. (Patient not taking: Reported on 08/14/2019) 12 tablet 0   No current facility-administered medications for this visit.    OBJECTIVE: There were no vitals filed for this visit.   There is no height or weight on file to calculate BMI.    ECOG FS:0 - Asymptomatic  General: Well-developed, well-nourished, no acute distress. HEENT: Normocephalic. Neuro: Alert, answering all questions appropriately. Cranial nerves grossly intact. Psych: Normal affect.   LAB RESULTS:  No results found for: NA, K, CL, CO2, GLUCOSE, BUN, CREATININE, CALCIUM, PROT, ALBUMIN, AST, ALT, ALKPHOS, BILITOT, GFRNONAA, GFRAA  Lab Results  Component Value Date   WBC 6.0 09/09/2016   HGB 9.3 (L) 09/09/2016   HCT 28.8 (L) 09/09/2016   MCV 66.4 (L) 09/09/2016   PLT 184 09/09/2016     STUDIES: No results found.  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 Oncotype DX 24, intermediate risk.  PLAN:    1.  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  Because patient's Oncotype score was intermediate risk, she was offered  chemotherapy which she declined. Given the small size of her tumor, it was agreed upon that is likely not necessary. Patient completed adjuvant XRT.  Patient has now discontinued letrozole and anastrozole secondary to side effects.  Continue tamoxifen for a total of 5 years of treatment completing in approximately July 2023.  Given patient's intermediate risk, she may benefit from extended treatment.  Her most recent mammogram on June 05, 2019 was reported as BI-RADS 2.  Repeat in March 2022.  Return to clinic in 6 months for routine evaluation.  2.  Osteoporosis: Patient's most recent bone mineral density on January 31, 2019 revealed a T score of -3.1.  This is significantly worse than 1 year prior when the T score was reported -2.4.  She was given a prescription for Fosamax which she declined to take.  It is unclear her compliance with calcium and vitamin D supplementation.  Patient has agreed to receive Prolia and will return to clinic in 1 week to receive an injection.  Repeat bone mineral density in October 2021.  Patient will then follow-up 1 to 2 days later for further evaluation, laboratory work, and continuation of  Prolia.  I provided 30 minutes of face-to-face video visit time during this encounter which included chart review, counseling, and coordination of care as documented above.   Patient expressed understanding and was in agreement with this plan. She also understands that She can call clinic at any time with any questions, concerns, or complaints.    Cancer Staging Primary cancer of upper outer quadrant of right female breast [facility]) Staging form: Breast, AJCC 8th Edition - Clinical stage from 06/15/2016: Stage IA (cT1b, cN0, cM0, G2, ER: Positive, PR: Positive, HER2: Negative) - Signed by Lloyd Huger, MD on 07/24/2016 - Pathologic stage from 07/24/2016: Stage IA (pT1b, pN0, cM0, G2, ER: Positive, PR: Positive, HER2: Negative, Oncotype DX score: 24) - Signed by Lloyd Huger,  MD on 07/24/2016   Lloyd Huger, MD   08/14/2019 3:59 PM

## 2019-08-14 ENCOUNTER — Inpatient Hospital Stay: Payer: 59 | Attending: Oncology | Admitting: Oncology

## 2019-08-14 ENCOUNTER — Encounter: Payer: Self-pay | Admitting: Oncology

## 2019-08-14 DIAGNOSIS — C50411 Malignant neoplasm of upper-outer quadrant of right female breast: Secondary | ICD-10-CM | POA: Diagnosis not present

## 2019-08-14 DIAGNOSIS — M810 Age-related osteoporosis without current pathological fracture: Secondary | ICD-10-CM | POA: Diagnosis not present

## 2019-08-14 NOTE — Progress Notes (Signed)
Patient prescreened for appointment. Patient has no concerns or questions.  

## 2019-08-23 ENCOUNTER — Inpatient Hospital Stay: Payer: 59

## 2019-09-04 ENCOUNTER — Inpatient Hospital Stay: Payer: 59

## 2019-09-04 ENCOUNTER — Other Ambulatory Visit: Payer: Self-pay

## 2019-09-04 ENCOUNTER — Other Ambulatory Visit: Payer: 59

## 2019-09-04 DIAGNOSIS — M810 Age-related osteoporosis without current pathological fracture: Secondary | ICD-10-CM

## 2019-09-04 DIAGNOSIS — C50411 Malignant neoplasm of upper-outer quadrant of right female breast: Secondary | ICD-10-CM

## 2019-09-11 ENCOUNTER — Telehealth: Payer: Self-pay | Admitting: *Deleted

## 2019-09-11 ENCOUNTER — Inpatient Hospital Stay: Payer: 59

## 2019-09-11 NOTE — Telephone Encounter (Signed)
Pt called and left a message stating that she wanted both 6/8 lab/INJ appt cx and did not say if she wanted to R/S just to have them cx. I called her back and left her a message to contact the to R/S for another date that works best for her.

## 2020-01-02 ENCOUNTER — Encounter: Payer: Self-pay | Admitting: Radiation Oncology

## 2020-01-02 ENCOUNTER — Ambulatory Visit
Admission: RE | Admit: 2020-01-02 | Discharge: 2020-01-02 | Disposition: A | Discharge status: Home / Self Care | Payer: 59 | Source: Ambulatory Visit | Attending: Radiation Oncology | Admitting: Radiation Oncology

## 2020-01-02 ENCOUNTER — Other Ambulatory Visit: Payer: Self-pay

## 2020-01-02 VITALS — BP 120/51 | HR 76 | Temp 99.2°F | Wt 128.0 lb

## 2020-01-02 DIAGNOSIS — C50411 Malignant neoplasm of upper-outer quadrant of right female breast: Secondary | ICD-10-CM

## 2020-01-02 NOTE — Progress Notes (Signed)
Radiation Oncology Follow up Note  Name: Erin Stevens   Date:   01/02/2020 MRN:  [id number] DOB: May 22, 1960    This 59 y.o. female presents to the clinic today for 3-year follow-up status post whole breast radiation to her right breast for stage I ER/PR positive invasive mammary carcinoma.  REFERRING PROVIDER: [name], MD  HPI: Patient is a 59 year old female now at 3 years having completed.  Whole breast radiation to her right breast for stage I ER/PR positive invasive mammary carcinoma.  She is seen today in routine follow-up and is doing well.  She specifically denies breast tenderness cough or bone pain.  She had mammograms back in March which I have reviewed and they were BI-RADS 2 benign.  She is currently on tamoxifen tolerating that well without side effect.  COMPLICATIONS OF TREATMENT: none  FOLLOW UP COMPLIANCE: keeps appointments   PHYSICAL EXAM:  BP (!) 120/51   Pulse 76   Temp 99.2 F (37.3 C) (Tympanic)   Wt 128 lb (58.1 kg)   BMI 21.97 kg/m  Lungs are clear to A&P cardiac examination essentially unremarkable with regular rate and rhythm. No dominant mass or nodularity is noted in either breast in 2 positions examined. Incision is well-healed. No axillary or supraclavicular adenopathy is appreciated. Cosmetic result is excellent.  Well-developed well-nourished patient in NAD. HEENT reveals PERLA, EOMI, discs not visualized.  Oral cavity is clear. No oral mucosal lesions are identified. Neck is clear without evidence of cervical or supraclavicular adenopathy. Lungs are clear to A&P. Cardiac examination is essentially unremarkable with regular rate and rhythm without murmur rub or thrill. Abdomen is benign with no organomegaly or masses noted. Motor sensory and DTR levels are equal and symmetric in the upper and lower extremities. Cranial nerves II through XII are grossly intact. Proprioception is intact. No peripheral adenopathy or edema is identified. No motor or sensory  levels are noted. Crude visual fields are within normal range.  RADIOLOGY RESULTS: Mammogram and ultrasound reviewed compatible with above-stated findings  PLAN: At the present time she is now 3 years out with no evidence of disease.  I am pleased with her overall progress.  I have asked to see her back in 1 year for follow-up.  She continues on tamoxifen without side effect.  She is already scheduled for follow-up mammograms.  Patient knows to call with any concerns.  I would like to take this opportunity to thank you for allowing me to participate in the care of your patient.Noreene Filbert, MD

## 2020-02-01 ENCOUNTER — Other Ambulatory Visit: Payer: Self-pay | Admitting: Oncology

## 2020-02-02 NOTE — Progress Notes (Signed)
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 Oncotype DX 24, intermediate risk.  INTERVAL HISTORY: Patient returns to clinic today for further evaluation and initiation of Zometa for her osteoporosis.  She continues to take tamoxifen without significant side effects.  She currently feels well and is asymptomatic. She has no neurologic complaints. She denies any recent fevers or illnesses. She has a good appetite and denies weight loss. She denies any chest pain, shortness of breath, cough, or hemoptysis.  She denies any nausea, vomiting, constipation, or diarrhea. She has no urinary complaints.  Patient offers no specific complaints today.    REVIEW OF SYSTEMS:   Review of Systems  Constitutional: Negative.  Negative for diaphoresis, fever, malaise/fatigue and weight loss.  Respiratory: Negative.  Negative for cough and shortness of breath.   Cardiovascular: Negative.  Negative for chest pain and leg swelling.  Gastrointestinal: Negative.  Negative for abdominal pain.  Genitourinary: Negative.  Negative for dysuria.  Musculoskeletal: Negative.  Negative for joint pain and myalgias.  Skin: Negative.  Negative for rash.  Neurological: Negative.  Negative for sensory change, focal weakness, weakness and headaches.  Psychiatric/Behavioral: Negative.  The patient is not nervous/anxious.     As per HPI. Otherwise, a complete review of systems is negative.  PAST MEDICAL HISTORY: Past Medical History:  Diagnosis Date  . Anemia   . Anxiety   . Arthritis    LOWER BACK  . Breast cancer ([place]) 06/2016   right breast INVASIVE  MAMMARY CARCINOMA  . Cervical dysplasia   . DDD (degenerative disc disease), lumbar   . Personal history of radiation therapy 2018   right breast ca    PAST SURGICAL HISTORY: Past Surgical History:  Procedure Laterality Date  . BREAST BIOPSY Right 06/11/2016   INVASIVE MAMMARY CARCINOMA  . BREAST LUMPECTOMY Right 07/08/2016   INVASIVE MAMMARY CARCINOMA, clear margins, negative LN  . CERVIX SURGERY    . PARTIAL MASTECTOMY WITH NEEDLE LOCALIZATION Right 07/08/2016   Procedure: PARTIAL MASTECTOMY WITH NEEDLE LOCALIZATION;  Surgeon: Erin Green, MD;  Location: [facility] ORS;  Service: General;  Laterality: Right;  . SENTINEL NODE BIOPSY Right 07/08/2016   Procedure: SENTINEL NODE BIOPSY;  Surgeon: Erin Green, MD;  Location: [facility] ORS;  Service: General;  Laterality: Right;  . WISDOM TOOTH EXTRACTION     AGE 61 OR 11    FAMILY HISTORY: Family History  Problem Relation Age of Onset  . Breast cancer Neg Hx     ADVANCED DIRECTIVES (Y/N):  N  HEALTH MAINTENANCE: Social History   Tobacco Use  . Smoking status: Former Smoker    Years: 2.00    Types: Cigarettes    Quit date: 07/01/1996    Years since quitting: 23.6  . Smokeless tobacco: Never Used  . Tobacco comment: ONLY WEEKEND SMOKER  Substance Use Topics  . Alcohol use: No  . Drug use: No     Colonoscopy:  PAP:  Bone density:  Lipid panel:  No Known Allergies  Current Outpatient Medications  Medication Sig Dispense Refill  . ALPRAZolam (XANAX) 0.25 MG tablet Take 1 tablet by mouth daily as needed.    Marland Kitchen HYDROcodone-acetaminophen (  NORCO) 10-325 MG tablet Take 1 tablet by mouth daily as needed.    . tamoxifen (NOLVADEX) 20 MG tablet TAKE 1 TABLET(20 MG) BY MOUTH DAILY 90 tablet 3  . traMADol (ULTRAM) 50 MG tablet Take 50 mg by mouth every 6 (six) hours as needed.    . zolpidem (AMBIEN) 10 MG tablet Take 1 tablet by mouth at bedtime.    Marland Kitchen alendronate (FOSAMAX) 70 MG tablet Take 1 tablet (70 mg total) by mouth  once a week. Take with a full glass of water on an empty stomach. (Patient not taking: Reported on 01/02/2020) 12 tablet 0   No current facility-administered medications for this visit.    OBJECTIVE: Vitals:   02/07/20 1101  BP: (!) 119/54  Pulse: 74  Temp: (!) 97.2 F (36.2 C)  SpO2: 100%     Body mass index is 21.97 kg/m.    ECOG FS:0 - Asymptomatic  General: Well-developed, well-nourished, no acute distress. Eyes: Stevens conjunctiva, anicteric sclera. HEENT: Normocephalic, moist mucous membranes. Lungs: No audible wheezing or coughing. Heart: Regular rate and rhythm. Abdomen: Soft, nontender, no obvious distention. Musculoskeletal: No edema, cyanosis, or clubbing. Neuro: Alert, answering all questions appropriately. Cranial nerves grossly intact. Skin: No rashes or petechiae noted. Psych: Normal affect.   LAB RESULTS:  Lab Results  Component Value Date   NA 141 02/07/2020   K 3.8 02/07/2020   CL 102 02/07/2020   CO2 28 02/07/2020   GLUCOSE 142 (H) 02/07/2020   BUN 10 02/07/2020   CREATININE 0.84 02/07/2020   CALCIUM 8.9 02/07/2020   GFRNONAA >60 02/07/2020    Lab Results  Component Value Date   WBC 6.0 09/09/2016   HGB 9.3 (L) 09/09/2016   HCT 28.8 (L) 09/09/2016   MCV 66.4 (L) 09/09/2016   PLT 184 09/09/2016     STUDIES: DG Bone Density  Result Date: 02/04/2020 EXAM: DUAL X-RAY ABSORPTIOMETRY (DXA) FOR BONE MINERAL DENSITY IMPRESSION: Your patient Erin Stevens completed a BMD test on 02/04/2020 using the [place] (software version: 14.10) manufactured by [employer]. The following summarizes the results of our evaluation. Technologist: SCE PATIENT BIOGRAPHICAL: Name: Erin Stevens Patient ID: [id number] Birth Date: 04/25/1960 Height: 64.0 in. Gender: Female Exam Date: 02/04/2020 Weight: 127.2 lbs. Indications: Breast CA, Caucasian, History of Osteoporosis, History of Radiation, Low Calcium Intake, Postmenopausal Fractures: Treatments:  Tamoxifen, letrozole DENSITOMETRY RESULTS: Site      Region     Measured Date Measured Age WHO Classification Young Adult T-score BMD         %Change vs. Previous Significant Change (*) AP Spine L1-L4 02/04/2020 61.8 Osteopenia -2.0 0.946 g/cm2 -2.6% - AP Spine L1-L4 01/31/2019 60.8 Osteopenia -1.8 0.971 g/cm2 4.0% Yes AP Spine L1-L4 12/21/2017 59.6 Osteopenia -2.1 0.934 g/cm2 2.3% - AP Spine L1-L4 11/23/2016 58.6 Osteopenia -2.3 0.913 g/cm2 - - DualFemur Neck Right 02/04/2020 61.8 Osteopenia -2.3 0.721 g/cm2 2.4% - DualFemur Neck Right 01/31/2019 60.8 Osteopenia -2.4 0.704 g/cm2 -0.8% - DualFemur Neck Right 12/21/2017 59.6 Osteopenia -2.4 0.710 g/cm2 -0.4% - DualFemur Neck Right 11/23/2016 58.6 Osteopenia -2.3 0.713 g/cm2 - - DualFemur Total Mean 02/04/2020 61.8 Osteopenia -1.6 0.806 g/cm2 -0.1% - DualFemur Total Mean 01/31/2019 60.8 Osteopenia -1.6 0.807 g/cm2 1.5% - DualFemur Total Mean 12/21/2017 59.6 Osteopenia -1.7 0.795 g/cm2 2.8% Yes DualFemur Total Mean 11/23/2016 58.6 Osteopenia -1.9 0.773 g/cm2 - - ASSESSMENT: The BMD measured at Femur Neck Right is 0.721 g/cm2 with a T-score of -2.3. This patient is considered osteopenic  according to [facility]) criteria. The scan quality is good. Compared with prior study, there has been no significant change in the spine. Compared with prior study, there has been no significant change in the total hip. [facility]) criteria for post-menopausal, Caucasian Women: Normal:                   T-score at or above -1 SD Osteopenia/low bone mass: T-score between -1 and -2.5 SD Osteoporosis:             T-score at or below -2.5 SD RECOMMENDATIONS: 1. All patients should optimize calcium and vitamin D intake. 2. Consider FDA-approved medical therapies in postmenopausal women and men aged 97 years and older, based on the following: a. A hip or vertebral(clinical or morphometric) fracture b. T-score < -2.5 at the femoral neck or spine after  appropriate evaluation to exclude secondary causes c. Low bone mass (T-score between -1.0 and -2.5 at the femoral neck or spine) and a 10-year probability of a hip fracture > 3% or a 10-year probability of a major osteoporosis-related fracture > 20% based on the [place]-adapted WHO algorithm 3. Clinician judgment and/or patient preferences may indicate treatment for people with 10-year fracture probabilities above or below these levels FOLLOW-UP: People with diagnosed cases of osteoporosis or at high risk for fracture should have regular bone mineral density tests. For patients eligible for Medicare, routine testing is allowed once every 2 years. The testing frequency can be increased to one year for patients who have rapidly progressing disease, those who are receiving or discontinuing medical therapy to restore bone mass, or have additional risk factors. I have reviewed this report, and agree with the above findings. [facility] Dear Dr Grayland Ormond, Your patient YOLTZIN RANSOM completed a FRAX assessment on 02/04/2020 using the [place] (analysis version: 14.10) manufactured by [employer]. The following summarizes the results of our evaluation. PATIENT BIOGRAPHICAL: Name: Lakeisa, Heninger Patient ID: [id number] Birth Date: 1960-07-11 Height:    64.0 in. Gender:     Female    [age]        61.8       Weight:    127.2 lbs. Ethnicity:  White                            Exam Date: 02/04/2020 FRAX* RESULTS:  (version: 3.5) 10-year Probability of Fracture1 Major Osteoporotic Fracture2 Hip Fracture 10.5% 1.8% Population: [place] (Caucasian) Risk Factors: None Based on Femur (Right) Neck BMD 1 -The 10-year probability of fracture may be lower than reported if the patient has received treatment. 2 -Major Osteoporotic Fracture: Clinical Spine, Forearm, Hip or Shoulder *FRAX is a Materials engineer of the [employer] for Metabolic Bone Disease, a [place] (WHO)  [facility]. ASSESSMENT: The probability of a major osteoporotic fracture is 10.5% within the next ten years. The probability of a hip fracture is 1.8% within the next ten years. . Electronically Signed   By: Lovey Newcomer M.D.   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 Oncotype DX 24, intermediate risk.  PLAN:    1.  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  Because patient's Oncotype score  was intermediate risk, she was offered chemotherapy which she declined. Given the small size of her tumor, it was agreed upon that is likely not necessary. Patient completed adjuvant XRT.  Patient has now discontinued letrozole and anastrozole secondary to side effects.  Continue tamoxifen for a total of 5 years of treatment completing in approximately July 2023.  Given patient's intermediate risk, she may benefit from extended treatment for 7 to 10 years.  Her most recent mammogram on June 05, 2019 was reported as BI-RADS 2.  Repeat in March 2022.  Return to clinic in 6 months for routine evaluation. 2.  Osteoporosis: Patient's most recent bone mineral density on January 31, 2019 revealed a T score of -3.1.  This is significantly worse than 1 year prior when the T score was reported -2.4.  Although her repeat bone mineral density on February 04, 2020 revealed a T score of -2.3.  Patient states that she takes calcium and vitamin D supplementation and has agreed to IV Zometa today.  Return to clinic in 6 months as above with repeat laboratory work and continuation of treatment.  Repeat bone mineral density in November 2022.    I spent a total of 30 minutes reviewing chart data, face-to-face evaluation with the patient, counseling and coordination of care as detailed  above.   Patient expressed understanding and was in agreement with this plan. She also understands that She can call clinic at any time with any questions, concerns, or complaints.    Cancer Staging Primary cancer of upper outer quadrant of right female breast [facility]) Staging form: Breast, AJCC 8th Edition - Clinical stage from 06/15/2016: Stage IA (cT1b, cN0, cM0, G2, ER: Positive, PR: Positive, HER2: Negative) - Signed by Erin Huger, MD on 07/24/2016 - Pathologic stage from 07/24/2016: Stage IA (pT1b, pN0, cM0, G2, ER: Positive, PR: Positive, HER2: Negative, Oncotype DX score: 24) - Signed by Erin Huger, MD on 07/24/2016   Erin Huger, MD   02/08/2020 2:53 PM

## 2020-02-04 ENCOUNTER — Ambulatory Visit
Admission: RE | Admit: 2020-02-04 | Discharge: 2020-02-04 | Disposition: A | Discharge status: Home / Self Care | Payer: 59 | Source: Ambulatory Visit | Attending: Oncology | Admitting: Oncology

## 2020-02-04 ENCOUNTER — Other Ambulatory Visit: Payer: Self-pay

## 2020-02-04 DIAGNOSIS — C50411 Malignant neoplasm of upper-outer quadrant of right female breast: Secondary | ICD-10-CM | POA: Diagnosis present

## 2020-02-07 ENCOUNTER — Ambulatory Visit: Payer: 59

## 2020-02-07 ENCOUNTER — Inpatient Hospital Stay: Payer: 59 | Attending: Oncology | Admitting: Oncology

## 2020-02-07 ENCOUNTER — Encounter: Payer: Self-pay | Admitting: Oncology

## 2020-02-07 ENCOUNTER — Inpatient Hospital Stay: Payer: 59

## 2020-02-07 ENCOUNTER — Other Ambulatory Visit: Payer: Self-pay

## 2020-02-07 VITALS — BP 119/54 | HR 74 | Temp 97.2°F | Ht 64.0 in | Wt 128.0 lb

## 2020-02-07 VITALS — BP 120/55 | HR 62 | Resp 18

## 2020-02-07 DIAGNOSIS — C50411 Malignant neoplasm of upper-outer quadrant of right female breast: Secondary | ICD-10-CM

## 2020-02-07 DIAGNOSIS — Z170 Estrogen receptor positive status [ER+]: Secondary | ICD-10-CM | POA: Insufficient documentation

## 2020-02-07 DIAGNOSIS — Z79810 Long term (current) use of selective estrogen receptor modulators (SERMs): Secondary | ICD-10-CM | POA: Diagnosis not present

## 2020-02-07 DIAGNOSIS — M810 Age-related osteoporosis without current pathological fracture: Secondary | ICD-10-CM | POA: Insufficient documentation

## 2020-02-07 DIAGNOSIS — Z87891 Personal history of nicotine dependence: Secondary | ICD-10-CM | POA: Insufficient documentation

## 2020-02-07 DIAGNOSIS — Z923 Personal history of irradiation: Secondary | ICD-10-CM | POA: Diagnosis not present

## 2020-02-07 LAB — BASIC METABOLIC PANEL
Anion gap: 11 (ref 5–15)
BUN: 10 mg/dL (ref 8–23)
CO2: 28 mmol/L (ref 22–32)
Calcium: 8.9 mg/dL (ref 8.9–10.3)
Chloride: 102 mmol/L (ref 98–111)
Creatinine, Ser: 0.84 mg/dL (ref 0.44–1.00)
GFR, Estimated: >60 mL/min (ref >60)
Glucose, Bld: 142 mg/dL — ABNORMAL HIGH (ref 70–99)
Potassium: 3.8 mmol/L (ref 3.5–5.1)
Sodium: 141 mmol/L (ref 135–145)

## 2020-02-07 MED ORDER — SODIUM CHLORIDE 0.9 % IV SOLN
Freq: Once | INTRAVENOUS | Status: AC
  Filled 2020-02-07: qty 250

## 2020-02-07 MED ORDER — ZOLEDRONIC ACID 4 MG/100ML IV SOLN
4.0000 mg | Freq: Once | INTRAVENOUS | Status: AC
  Administered 2020-02-07: 4 mg via INTRAVENOUS
  Filled 2020-02-07: qty 100

## 2020-02-07 NOTE — Progress Notes (Signed)
Patient tolerated infusion well. Patient and VSS. Discharged home  

## 2020-03-07 ENCOUNTER — Ambulatory Visit (INDEPENDENT_AMBULATORY_CARE_PROVIDER_SITE_OTHER): Payer: 59 | Admitting: Psychology

## 2020-03-07 DIAGNOSIS — F4323 Adjustment disorder with mixed anxiety and depressed mood: Secondary | ICD-10-CM

## 2020-03-14 ENCOUNTER — Ambulatory Visit (INDEPENDENT_AMBULATORY_CARE_PROVIDER_SITE_OTHER): Payer: 59 | Admitting: Psychology

## 2020-03-14 DIAGNOSIS — F4323 Adjustment disorder with mixed anxiety and depressed mood: Secondary | ICD-10-CM

## 2020-03-20 ENCOUNTER — Ambulatory Visit: Payer: 59 | Admitting: Psychology

## 2020-03-25 ENCOUNTER — Ambulatory Visit: Payer: 59 | Admitting: Psychology

## 2020-04-02 ENCOUNTER — Ambulatory Visit: Payer: 59 | Admitting: Psychology

## 2020-05-01 ENCOUNTER — Other Ambulatory Visit: Payer: Self-pay | Admitting: *Deleted

## 2020-05-01 ENCOUNTER — Other Ambulatory Visit: Payer: Self-pay | Admitting: Family Medicine

## 2020-05-01 DIAGNOSIS — Z1231 Encounter for screening mammogram for malignant neoplasm of breast: Secondary | ICD-10-CM

## 2020-05-01 MED ORDER — TAMOXIFEN CITRATE 20 MG PO TABS: ORAL_TABLET | ORAL | 3 refills | Status: DC

## 2020-06-06 ENCOUNTER — Ambulatory Visit
Admission: RE | Admit: 2020-06-06 | Discharge: 2020-06-06 | Disposition: A | Discharge status: Home / Self Care | Payer: Managed Care, Other (non HMO) | Source: Ambulatory Visit | Attending: Family Medicine | Admitting: Family Medicine

## 2020-06-06 ENCOUNTER — Other Ambulatory Visit: Payer: Self-pay

## 2020-06-06 DIAGNOSIS — Z1231 Encounter for screening mammogram for malignant neoplasm of breast: Secondary | ICD-10-CM | POA: Diagnosis present

## 2020-07-10 ENCOUNTER — Ambulatory Visit: Payer: 59 | Admitting: Dermatology

## 2020-08-04 ENCOUNTER — Ambulatory Visit
Admission: EM | Admit: 2020-08-04 | Discharge: 2020-08-04 | Disposition: A | Discharge status: Home / Self Care | Payer: Managed Care, Other (non HMO)

## 2020-08-04 ENCOUNTER — Other Ambulatory Visit: Payer: Self-pay

## 2020-08-04 DIAGNOSIS — M545 Low back pain, unspecified: Secondary | ICD-10-CM | POA: Diagnosis not present

## 2020-08-04 MED ORDER — KETOROLAC TROMETHAMINE 30 MG/ML IJ SOLN
30.0000 mg | Freq: Once | INTRAMUSCULAR | Status: AC
  Administered 2020-08-04: 30 mg via INTRAMUSCULAR

## 2020-08-04 MED ORDER — METHOCARBAMOL 500 MG PO TABS: 500.0000 mg | ORAL_TABLET | Freq: Two times a day (BID) | ORAL | 0 refills | Status: DC | PRN

## 2020-08-04 NOTE — ED Provider Notes (Signed)
Erin Stevens    CSN: [id number] Arrival date & time: 08/04/20  1511      History   Chief Complaint Chief Complaint  Patient presents with  . Back Pain    HPI Erin Stevens is a 60 y.o. female.     Patient presents with left lower back pain x3 days.  The pain started when she was lifting a litter box.  It is worse with movement and ambulation.  Improves with rest.  She denies numbness, weakness, paresthesias, saddle anesthesia, loss of bowel/bladder control, abdominal pain, dysuria, pelvic pain, or other symptoms.  Treatment attempted at home with Tylenol and tramadol.  Her medical history includes breast cancer, chronic back pain, arthritis, DDD, anxiety.  The history is provided by the patient and medical records.    Past Medical History:  Diagnosis Date  . Anemia   . Anxiety   . Arthritis    LOWER BACK  . Breast cancer ([place]) 06/2016   right breast INVASIVE MAMMARY CARCINOMA  . Cervical dysplasia   . DDD (degenerative disc disease), lumbar   . Personal history of radiation therapy 2018   right breast ca    Patient Active Problem List   Diagnosis Date Noted  . Osteoporosis 08/14/2019  . History of anemia 02/08/2019  . Menstrual problem 02/08/2019  . Migraines 02/08/2019  . Neuralgia and neuritis 02/08/2019  . Pelvic pain in female 02/08/2019  . Chronic bilateral low back pain with left-sided sciatica 01/04/2019  . Primary cancer of upper outer quadrant of right female breast ([place]) 06/15/2016    Past Surgical History:  Procedure Laterality Date  . BREAST BIOPSY Right 06/11/2016   INVASIVE MAMMARY CARCINOMA  . BREAST LUMPECTOMY Right 07/08/2016   INVASIVE MAMMARY CARCINOMA, clear margins, negative LN  . CERVIX SURGERY    . PARTIAL MASTECTOMY WITH NEEDLE LOCALIZATION Right 07/08/2016   Procedure: PARTIAL MASTECTOMY WITH NEEDLE LOCALIZATION;  Surgeon: Leonie Green, MD;  Location: [facility] ORS;  Service: General;  Laterality: Right;  . SENTINEL NODE BIOPSY  Right 07/08/2016   Procedure: SENTINEL NODE BIOPSY;  Surgeon: Leonie Green, MD;  Location: [facility] ORS;  Service: General;  Laterality: Right;  . WISDOM TOOTH EXTRACTION     AGE 42 OR 11    OB History   No obstetric history on file.      Home Medications    Prior to Admission medications   Medication Sig Start Date End Date Taking? Authorizing Provider  ALPRAZolam Duanne Moron) 0.25 MG tablet Take 1 tablet by mouth daily as needed. 04/19/16  Yes [provider]  citalopram (CELEXA) 20 MG tablet Take 20 mg by mouth daily.   Yes [provider]  HYDROcodone-acetaminophen (NORCO) 10-325 MG tablet Take 1 tablet by mouth daily as needed. 05/04/16  Yes [provider]  methocarbamol (ROBAXIN) 500 MG tablet Take 1 tablet (500 mg total) by mouth 2 (two) times daily as needed for muscle spasms. 08/04/20  Yes Sharion Balloon, NP  traMADol (ULTRAM) 50 MG tablet Take 50 mg by mouth every 6 (six) hours as needed.   Yes [provider]  zolpidem (AMBIEN) 10 MG tablet Take 1 tablet by mouth at bedtime. 04/19/16  Yes [provider]  alendronate (FOSAMAX) 70 MG tablet Take 1 tablet (70 mg total) by mouth once a week. Take with a full glass of water on an empty stomach. Patient not taking: No sig reported 02/08/19   Lloyd Huger, MD  tamoxifen (NOLVADEX) 20 MG  tablet TAKE 1 TABLET(20 MG) BY MOUTH DAILY 05/01/20   Lloyd Huger, MD    Family History Family History  Problem Relation Age of Onset  . Breast cancer Neg Hx     Social History Social History   Tobacco Use  . Smoking status: Former Smoker    Years: 2.00    Types: Cigarettes    Quit date: 07/01/1996    Years since quitting: 24.1  . Smokeless tobacco: Never Used  . Tobacco comment: ONLY WEEKEND SMOKER  Vaping Use  . Vaping Use: Never used  Substance Use Topics  . Alcohol use: No  . Drug use: No     Allergies   Patient has no known allergies.   Review of Systems Review of  Systems  Constitutional: Negative for chills and fever.  Respiratory: Negative for cough and shortness of breath.   Cardiovascular: Negative for chest pain and palpitations.  Gastrointestinal: Negative for abdominal pain and vomiting.  Genitourinary: Negative for dysuria, hematuria and pelvic pain.  Musculoskeletal: Positive for back pain. Negative for arthralgias.  Skin: Negative for color change and rash.  Neurological: Negative for syncope, weakness and numbness.  All other systems reviewed and are negative.    Physical Exam Triage Vital Signs ED Triage Vitals  Enc Vitals Group     BP      Pulse      Resp      Temp      Temp src      SpO2      Weight      Height      Head Circumference      Peak Flow      Pain Score      Pain Loc      Pain Edu?      Excl. in [place]?    No data found.  Updated Vital Signs BP (!) 149/77 (BP Location: Left Arm)   Pulse 78   Temp 98.1 F (36.7 C) (Oral)   Resp 20   SpO2 98%   Visual Acuity Right Eye Distance:   Left Eye Distance:   Bilateral Distance:    Right Eye Near:   Left Eye Near:    Bilateral Near:     Physical Exam Vitals and nursing note reviewed.  Constitutional:      General: She is not in acute distress.    Appearance: She is well-developed. She is not ill-appearing.  HENT:     Head: Normocephalic and atraumatic.     Mouth/Throat:     Mouth: Mucous membranes are moist.  Eyes:     Conjunctiva/sclera: Conjunctivae normal.  Cardiovascular:     Rate and Rhythm: Normal rate and regular rhythm.     Heart sounds: Normal heart sounds.  Pulmonary:     Effort: Pulmonary effort is normal. No respiratory distress.     Breath sounds: Normal breath sounds.  Abdominal:     Palpations: Abdomen is soft.     Tenderness: There is no abdominal tenderness. There is no right CVA tenderness, left CVA tenderness, guarding or rebound.  Musculoskeletal:        General: No swelling, tenderness, deformity or signs of injury. Normal  range of motion.     Cervical back: Neck supple.  Skin:    General: Skin is warm and dry.     Capillary Refill: Capillary refill takes less than 2 seconds.     Findings: No bruising, erythema or rash.  Neurological:  General: No focal deficit present.     Mental Status: She is alert and oriented to person, place, and time.     Sensory: No sensory deficit.     Motor: No weakness.     Gait: Gait normal.     Comments: Negative straight leg raise.  Psychiatric:        Mood and Affect: Mood normal.        Behavior: Behavior normal.      UC Treatments / Results  Labs (all labs ordered are listed, but only abnormal results are displayed) Labs Reviewed - No data to display  EKG   Radiology No results found.  Procedures Procedures (including critical care time)  Medications Ordered in UC Medications  ketorolac (TORADOL) 30 MG/ML injection 30 mg (30 mg Intramuscular Given 08/04/20 1643)    Initial Impression / Assessment and Plan / UC Course  I have reviewed the triage vital signs and the nursing notes.  Pertinent labs & imaging results that were available during my care of the patient were reviewed by me and considered in my medical decision making (see chart for details).   Acute left lower back pain without sciatica.  Treating with Toradol injection per patient request.  Also treating with Robaxin; precautions for drowsiness with this medication discussed.  Instructed her to follow-up with her PCP or an orthopedist if her symptoms are not improving.  She agrees to plan of care.   Final Clinical Impressions(s) / UC Diagnoses   Final diagnoses:  Acute left-sided low back pain without sciatica     Discharge Instructions     You were given an injection of Toradol today.    Take the muscle relaxer as needed for muscle spasm; Do not drive, operate machinery, or drink alcohol with this medication as it can cause drowsiness.   Follow up with your primary care provider or  an orthopedist if your symptoms are not improving.        ED Prescriptions    Medication Sig Dispense Auth. Provider   methocarbamol (ROBAXIN) 500 MG tablet Take 1 tablet (500 mg total) by mouth 2 (two) times daily as needed for muscle spasms. 10 tablet Sharion Balloon, NP     I have reviewed the PDMP during this encounter.   Sharion Balloon, NP 08/04/20 1650

## 2020-08-04 NOTE — Discharge Instructions (Addendum)
You were given an injection of Toradol today.    Take the muscle relaxer as needed for muscle spasm; Do not drive, operate machinery, or drink alcohol with this medication as it can cause drowsiness.   Follow up with your primary care provider or an orthopedist if your symptoms are not improving.

## 2020-08-04 NOTE — ED Triage Notes (Signed)
Pt states she lifted litter box 3 days ago and felt low back pain.  Has arthritis in her lower back.  Tried Tylenol and Tramadol at home.  Does not radiate.  Reports intermittent tingling in LLE and LUE for years.

## 2020-08-08 ENCOUNTER — Inpatient Hospital Stay: Payer: Managed Care, Other (non HMO) | Admitting: Oncology

## 2020-08-08 ENCOUNTER — Inpatient Hospital Stay: Payer: Managed Care, Other (non HMO)

## 2020-08-08 ENCOUNTER — Telehealth: Payer: Self-pay | Admitting: Oncology

## 2020-08-08 NOTE — Telephone Encounter (Signed)
Pt did not answer to get 08/08/20 lab\Md\Zometa appt rescheduled. Left Vm to contact office for rescheduling.

## 2020-09-25 ENCOUNTER — Encounter: Payer: Self-pay | Admitting: Oncology

## 2020-09-26 ENCOUNTER — Encounter: Payer: Self-pay | Admitting: Oncology

## 2020-09-27 NOTE — Progress Notes (Signed)
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 Oncotype DX 24, intermediate risk.  INTERVAL HISTORY: Patient returns to clinic today for routine 38-monthevaluation.  She continues to take tamoxifen and is tolerating treatment well without significant side effects.  Patient states she had increased bony aches with Zometa with her last infusion and is refusing treatment today.  She has no neurologic complaints. She denies any recent fevers or illnesses. She has a good appetite and denies weight loss. She denies any chest pain, shortness of breath, cough, or hemoptysis.  She denies any nausea, vomiting, constipation, or diarrhea. She has no urinary complaints.  Patient offers no specific complaints today.  REVIEW OF SYSTEMS:   Review of Systems  Constitutional: Negative.  Negative for diaphoresis, fever, malaise/fatigue and weight loss.  Respiratory: Negative.  Negative for cough and shortness of breath.   Cardiovascular: Negative.  Negative for chest pain and leg swelling.  Gastrointestinal: Negative.  Negative for abdominal pain.  Genitourinary: Negative.  Negative for dysuria.  Musculoskeletal: Negative.  Negative for joint pain and myalgias.  Skin: Negative.  Negative for rash.  Neurological: Negative.  Negative for sensory change, focal weakness, weakness and headaches.  Psychiatric/Behavioral: Negative.  The patient is not nervous/anxious.    As per HPI. Otherwise, a complete review of systems is negative.  PAST MEDICAL HISTORY: Past Medical History:  Diagnosis Date   Anemia    Anxiety    Arthritis    LOWER BACK    [place]   [place] breast INVASIVE MAMMARY CARCINOMA   Cervical dysplasia    DDD (degenerative disc disease), lumbar    Personal history of radiation therapy 2018   right breast ca    PAST SURGICAL HISTORY: Past Surgical History:  Procedure Laterality Date   BREAST BIOPSY Right 06/11/2016   INVASIVE MAMMARY CARCINOMA   BREAST LUMPECTOMY Right 07/08/2016   INVASIVE MAMMARY CARCINOMA, clear margins, negative LN   CERVIX SURGERY     PARTIAL MASTECTOMY WITH NEEDLE LOCALIZATION Right 07/08/2016   Procedure: PARTIAL MASTECTOMY WITH NEEDLE LOCALIZATION;  Surgeon: JLeonie Green MD;  Location: [facility] ORS;  Service: General;  Laterality: Right;   SENTINEL NODE BIOPSY Right 07/08/2016   Procedure: SENTINEL NODE BIOPSY;  Surgeon: JLeonie Green MD;  Location: [facility] ORS;  Service: General;  Laterality: Right;   WISDOM TOOTH EXTRACTION     AGE [age]OR 11    FAMILY HISTORY: Family History  Problem Relation Age of Onset   Breast cancer Neg Hx     ADVANCED DIRECTIVES (Y/N):  N  HEALTH MAINTENANCE: Social History   Tobacco Use   Smoking status: Former    Years: 2.00    Pack years: 0.00    Types: Cigarettes    Quit date: 07/01/1996    Years since quitting: 24.2   Smokeless tobacco: Never   Tobacco comments:    ONLY WEEKEND SMOKER  Vaping Use   Vaping Use: Never used  Substance Use Topics   Alcohol use: No   Drug use: No     Colonoscopy:  PAP:  Bone density:  Lipid panel:  No Known Allergies  Current Outpatient Medications  Medication Sig Dispense Refill   ALPRAZolam (XANAX) 0.25 MG tablet Take 1 tablet by mouth daily as needed.     HYDROcodone-acetaminophen (NORCO) 10-325 MG tablet Take 1 tablet by mouth daily as needed.     tamoxifen (NOLVADEX) 20 MG tablet TAKE 1 TABLET(20 MG) BY MOUTH DAILY 90 tablet 3   traMADol (ULTRAM) 50 MG tablet Take 50 mg by mouth every 6 (six) hours as needed.     zolpidem (AMBIEN) 10 MG tablet Take 1 tablet by mouth at  bedtime.     alendronate (FOSAMAX) 70 MG tablet Take 1 tablet (70 mg total) by mouth once a week. Take with a full glass of water on an empty stomach. (Patient not taking: No sig reported) 12 tablet 0   citalopram (CELEXA) 20 MG tablet Take 20 mg by mouth daily. (Patient not taking: Reported on 10/02/2020)     methocarbamol (ROBAXIN) 500 MG tablet Take 1 tablet (500 mg total) by mouth 2 (two) times daily as needed for muscle spasms. (Patient not taking: Reported on 10/02/2020) 10 tablet 0   No current facility-administered medications for this visit.    OBJECTIVE: Vitals:   10/02/20 1359  BP: 126/73  Pulse: 76  Resp: 16  Temp: 99.1 F (37.3 C)     Body mass index is 23.55 kg/m.    ECOG FS:0 - Asymptomatic  General: Well-developed, well-nourished, no acute distress. Eyes: Pink conjunctiva, anicteric sclera. HEENT: Normocephalic, moist mucous membranes. Breast: Patient declined exam today. Lungs: No audible wheezing or coughing. Heart: Regular rate and rhythm. Abdomen: Soft, nontender, no obvious distention. Musculoskeletal: No edema, cyanosis, or clubbing. Neuro: Alert, answering all questions appropriately. Cranial nerves grossly intact. Skin: No rashes or petechiae noted. Psych: Normal affect.   LAB RESULTS:  Lab Results  Component Value Date   NA 137 10/02/2020   K 3.7 10/02/2020   CL 102 10/02/2020   CO2 28 10/02/2020   GLUCOSE 155 (H) 10/02/2020   BUN 11 10/02/2020   CREATININE 0.76 10/02/2020   CALCIUM 8.5 (L) 10/02/2020   GFRNONAA >60 10/02/2020    Lab Results  Component Value Date   WBC 6.0 09/09/2016   HGB 9.3 (L) 09/09/2016   HCT 28.8 (L) 09/09/2016   MCV 66.4 (L) 09/09/2016   PLT 184 09/09/2016     STUDIES: No results found.   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 Oncotype DX 24, intermediate risk.  PLAN:    1.  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  Because patient's Oncotype score was intermediate risk, she was offered chemotherapy which she declined. Given the small size of her tumor, it was agreed upon that is likely not necessary. Patient completed adjuvant XRT.  Patient has now discontinued letrozole and anastrozole secondary to side effects.  Continue tamoxifen for a total of 5 years of treatment completing in approximately July 2023.  Given patient's intermediate risk, she may benefit from extended treatment for 7 to 10 years, but patient has already indicated that she would likely discontinue treatment as planned in July 2023.  Her most recent mammogram on June 06, 2020 was reported as BI-RADS 1.  Repeat in March 2023.  Return to clinic in 6 months for routine evaluation.   2.  Osteoporosis: Patient's most recent bone mineral density on January 31, 2019 revealed a  T score of -3.1.  This is significantly worse than 1 year prior when the T score was reported -2.4.  Although her repeat bone mineral density on February 04, 2020 revealed a T score of -2.3.  Patient states that she takes calcium and vitamin D supplementation.  She is declining any further Zometa or bisphosphonates despite acknowledging the risk of increased fracture.  Return to clinic as above.  Repeat bone mineral density in November 2022.      Patient expressed understanding and was in agreement with this plan. She also understands that She can call clinic at any time with any questions, concerns, or complaints.    Cancer Staging Primary cancer of upper outer quadrant of right female breast [facility]) Staging form: Breast, AJCC 8th Edition - Clinical stage from 06/15/2016: Stage IA (cT1b, cN0, cM0, G2, ER: Positive, PR: Positive, HER2: Negative) - Signed by Lloyd Huger, MD on 07/24/2016 Histologic grading system: 3 grade system Laterality: Right -  Pathologic stage from 07/24/2016: Stage IA (pT1b, pN0, cM0, G2, ER: Positive, PR: Positive, HER2: Negative, Oncotype DX score: 24) - Signed by Lloyd Huger, MD on 07/24/2016 Neoadjuvant therapy: No Multigene prognostic tests performed: Oncotype DX Recurrence score range: Greater than or equal to 11 Histologic grading system: 3 grade system Laterality: Right   Lloyd Huger, MD   10/03/2020 3:55 PM

## 2020-10-02 ENCOUNTER — Inpatient Hospital Stay: Payer: 59

## 2020-10-02 ENCOUNTER — Encounter: Payer: Self-pay | Admitting: Oncology

## 2020-10-02 ENCOUNTER — Inpatient Hospital Stay (HOSPITAL_BASED_OUTPATIENT_CLINIC_OR_DEPARTMENT_OTHER): Payer: 59 | Admitting: Oncology

## 2020-10-02 ENCOUNTER — Inpatient Hospital Stay: Payer: 59 | Attending: Oncology

## 2020-10-02 VITALS — BP 126/73 | HR 76 | Temp 99.1°F | Resp 16 | Wt 137.2 lb

## 2020-10-02 DIAGNOSIS — Z923 Personal history of irradiation: Secondary | ICD-10-CM | POA: Insufficient documentation

## 2020-10-02 DIAGNOSIS — Z79811 Long term (current) use of aromatase inhibitors: Secondary | ICD-10-CM

## 2020-10-02 DIAGNOSIS — Z87891 Personal history of nicotine dependence: Secondary | ICD-10-CM | POA: Insufficient documentation

## 2020-10-02 DIAGNOSIS — C50411 Malignant neoplasm of upper-outer quadrant of right female breast: Secondary | ICD-10-CM | POA: Diagnosis present

## 2020-10-02 DIAGNOSIS — Z170 Estrogen receptor positive status [ER+]: Secondary | ICD-10-CM | POA: Diagnosis not present

## 2020-10-02 DIAGNOSIS — Z79810 Long term (current) use of selective estrogen receptor modulators (SERMs): Secondary | ICD-10-CM | POA: Insufficient documentation

## 2020-10-02 DIAGNOSIS — M810 Age-related osteoporosis without current pathological fracture: Secondary | ICD-10-CM | POA: Diagnosis not present

## 2020-10-02 LAB — BASIC METABOLIC PANEL
Anion gap: 7 (ref 5–15)
BUN: 11 mg/dL (ref 8–23)
CO2: 28 mmol/L (ref 22–32)
Calcium: 8.5 mg/dL — ABNORMAL LOW (ref 8.9–10.3)
Chloride: 102 mmol/L (ref 98–111)
Creatinine, Ser: 0.76 mg/dL (ref 0.44–1.00)
GFR, Estimated: >60 mL/min (ref >60)
Glucose, Bld: 155 mg/dL — ABNORMAL HIGH (ref 70–99)
Potassium: 3.7 mmol/L (ref 3.5–5.1)
Sodium: 137 mmol/L (ref 135–145)

## 2020-10-02 NOTE — Progress Notes (Signed)
Patient does not want to receive the Zometa today due to increased bone aches after last infusion.

## 2020-10-03 ENCOUNTER — Encounter: Payer: Self-pay | Admitting: Oncology

## 2021-01-07 ENCOUNTER — Encounter: Payer: Self-pay | Admitting: Radiation Oncology

## 2021-01-07 ENCOUNTER — Ambulatory Visit
Admission: RE | Admit: 2021-01-07 | Discharge: 2021-01-07 | Disposition: A | Discharge status: Home / Self Care | Payer: 59 | Source: Ambulatory Visit | Attending: Radiation Oncology | Admitting: Radiation Oncology

## 2021-01-07 ENCOUNTER — Other Ambulatory Visit: Payer: Self-pay

## 2021-01-07 VITALS — BP 137/61 | HR 72 | Temp 97.1°F | Resp 16 | Wt 141.0 lb

## 2021-01-07 DIAGNOSIS — Z923 Personal history of irradiation: Secondary | ICD-10-CM | POA: Insufficient documentation

## 2021-01-07 DIAGNOSIS — C50411 Malignant neoplasm of upper-outer quadrant of right female breast: Secondary | ICD-10-CM | POA: Insufficient documentation

## 2021-01-07 DIAGNOSIS — Z79810 Long term (current) use of selective estrogen receptor modulators (SERMs): Secondary | ICD-10-CM | POA: Insufficient documentation

## 2021-01-07 DIAGNOSIS — Z170 Estrogen receptor positive status [ER+]: Secondary | ICD-10-CM | POA: Diagnosis not present

## 2021-01-07 NOTE — Progress Notes (Signed)
Radiation Oncology Follow up Note  Name: Erin Stevens   Date:   01/07/2021 MRN:  [id number] DOB: 1960/06/14    This 60 y.o. female presents to the clinic today for 4-year follow-up status post whole breast radiation to her right breast for stage I ER/PR positive invasive mammary carcinoma.  REFERRING PROVIDER: [name], MD  HPI: Patient is a 60 year old female now out for years having completed whole breast radiation to her right breast for stage I ER/PR positive invasive mammary carcinoma.  Seen today in routine follow-up she is doing well specifically denies breast tenderness cough or bone pain.  She is currently on tamoxifen tolerating it well without side effect.  She had a mammogram back in February.  Which I have reviewed was BI-RADS 1 negative.  COMPLICATIONS OF TREATMENT: none  FOLLOW UP COMPLIANCE: keeps appointments   PHYSICAL EXAM:  BP 137/61 (BP Location: Left Arm, Patient Position: Sitting)   Pulse 72   Temp (!) 97.1 F (36.2 C) (Tympanic)   Resp 16   Wt 141 lb (64 kg)   SpO2 100%   BMI 24.20 kg/m  Lungs are clear to A&P cardiac examination essentially unremarkable with regular rate and rhythm. No dominant mass or nodularity is noted in either breast in 2 positions examined. Incision is well-healed. No axillary or supraclavicular adenopathy is appreciated. Cosmetic result is excellent.  Well-developed well-nourished patient in NAD. HEENT reveals PERLA, EOMI, discs not visualized.  Oral cavity is clear. No oral mucosal lesions are identified. Neck is clear without evidence of cervical or supraclavicular adenopathy. Lungs are clear to A&P. Cardiac examination is essentially unremarkable with regular rate and rhythm without murmur rub or thrill. Abdomen is benign with no organomegaly or masses noted. Motor sensory and DTR levels are equal and symmetric in the upper and lower extremities. Cranial nerves II through XII are grossly intact. Proprioception is intact. No  peripheral adenopathy or edema is identified. No motor or sensory levels are noted. Crude visual fields are within normal range.  RADIOLOGY RESULTS: Mammograms reviewed compatible with above-stated findings  PLAN: Present time patient is doing well now out over 4 years with no evidence of disease PSA 1 more time in 1 year for follow-up and then discontinue follow-up care.  She continues on tamoxifen without side effect.  Patient knows to call with any concerns.  I would like to take this opportunity to thank you for allowing me to participate in the care of your patient.Noreene Filbert, MD

## 2021-01-22 ENCOUNTER — Ambulatory Visit: Payer: Managed Care, Other (non HMO) | Admitting: Dermatology

## 2021-01-29 ENCOUNTER — Other Ambulatory Visit: Payer: Self-pay | Admitting: Orthopedic Surgery

## 2021-01-29 ENCOUNTER — Encounter: Payer: Self-pay | Admitting: Orthopedic Surgery

## 2021-01-29 DIAGNOSIS — Z01818 Encounter for other preprocedural examination: Secondary | ICD-10-CM

## 2021-01-29 NOTE — H&P (Signed)
  NAME: Erin Stevens MRN:   [id number] DOB:   Sep 02, 1960     HISTORY AND PHYSICAL  CHIEF COMPLAINT:  left shoulder pain  HISTORY:   Erin Noyes Fuscois a 60 y.o. female  with left  Shoulder Pain Patient complains of left shoulder pain. The symptoms began several months ago. Aggravating factors: repetitive activity. Pain is located between the neck and shoulder. Discomfort is described as aching. Symptoms are exacerbated by repetitive movements. Evaluation to date: MRI: abnormal rotator cuff tear . Therapy to date includes: rest, ice, avoidance of offending activity, OTC analgesics which are somewhat effective, prescription NSAIDS which are somewhat effective, home exercises which are somewhat effective, physical therapy which was somewhat effective, and corticosteroid injection which was somewhat effective.   Plan for Left shoulder arthroscopy with rotator cuff repair.  PAST MEDICAL HISTORY:   Past Medical History:  Diagnosis Date   Anemia    Anxiety    Arthritis    LOWER BACK   Breast cancer ([place]) 06/2016   right breast INVASIVE MAMMARY CARCINOMA   Cervical dysplasia    DDD (degenerative disc disease), lumbar    Personal history of radiation therapy 2018   right breast ca    PAST SURGICAL HISTORY:   Past Surgical History:  Procedure Laterality Date   BREAST BIOPSY Right 06/11/2016   INVASIVE MAMMARY CARCINOMA   BREAST LUMPECTOMY Right 07/08/2016   INVASIVE MAMMARY CARCINOMA, clear margins, negative LN   CERVIX SURGERY     PARTIAL MASTECTOMY WITH NEEDLE LOCALIZATION Right 07/08/2016   Procedure: PARTIAL MASTECTOMY WITH NEEDLE LOCALIZATION;  Surgeon: Leonie Green, MD;  Location: [facility] ORS;  Service: General;  Laterality: Right;   SENTINEL NODE BIOPSY Right 07/08/2016   Procedure: SENTINEL NODE BIOPSY;  Surgeon: Leonie Green, MD;  Location: [facility] ORS;  Service: General;  Laterality: Right;   WISDOM TOOTH EXTRACTION     AGE 37 OR 11    MEDICATIONS:  (Not in a hospital  admission)   ALLERGIES:  No Known Allergies  REVIEW OF SYSTEMS:   Negative except HPI  FAMILY HISTORY:   Family History  Problem Relation Age of Onset   Breast cancer Neg Hx     SOCIAL HISTORY:   reports that she quit smoking about 24 years ago. Her smoking use included cigarettes. She has never used smokeless tobacco. She reports that she does not drink alcohol and does not use drugs.  PHYSICAL EXAM:  General appearance: alert, cooperative, and no distress Neck: no JVD and supple, symmetrical, trachea midline Resp: clear to auscultation bilaterally Cardio: regular rate and rhythm, S1, S2 normal, no murmur, click, rub or gallop GI: soft, non-tender; bowel sounds normal; no masses,  no organomegaly Extremities: extremities normal, atraumatic, no cyanosis or edema Skin: Skin color, texture, turgor normal. No rashes or lesions Neurologic: Alert and oriented X 3, normal strength and tone. Normal symmetric reflexes. Normal coordination and gait    LABORATORY STUDIES: No results for input(s): WBC, HGB, HCT, PLT in the last 72 hours.  No results for input(s): NA, K, CL, CO2, GLUCOSE, BUN, CREATININE, CALCIUM in the last 72 hours.  STUDIES/RESULTS:  No results found.  ASSESSMENT:  left shoulder rotator cuff tear and impingement        Active Problems:   * No active hospital problems. *    PLAN: left shoulder arthroplasty with rotator cuff repair  Carlynn Spry 01/29/2021. 3:56 PM

## 2021-02-02 ENCOUNTER — Encounter
Admission: RE | Admit: 2021-02-02 | Discharge: 2021-02-02 | Disposition: A | Discharge status: Home / Self Care | Payer: 59 | Source: Ambulatory Visit | Attending: Orthopedic Surgery | Admitting: Orthopedic Surgery

## 2021-02-02 ENCOUNTER — Encounter: Payer: Self-pay | Admitting: Urgent Care

## 2021-02-02 ENCOUNTER — Other Ambulatory Visit: Payer: Self-pay

## 2021-02-02 DIAGNOSIS — Z1389 Encounter for screening for other disorder: Secondary | ICD-10-CM | POA: Diagnosis not present

## 2021-02-02 DIAGNOSIS — R8290 Unspecified abnormal findings in urine: Secondary | ICD-10-CM | POA: Insufficient documentation

## 2021-02-02 DIAGNOSIS — Z01818 Encounter for other preprocedural examination: Secondary | ICD-10-CM

## 2021-02-02 DIAGNOSIS — Z01812 Encounter for preprocedural laboratory examination: Secondary | ICD-10-CM

## 2021-02-02 LAB — URINALYSIS, ROUTINE W REFLEX MICROSCOPIC
Bilirubin Urine: NEGATIVE
Glucose, UA: NEGATIVE mg/dL
Ketones, ur: NEGATIVE mg/dL
Nitrite: NEGATIVE
Protein, ur: NEGATIVE mg/dL
Specific Gravity, Urine: 1.021 (ref 1.005–1.030)
pH: 5 (ref 5.0–8.0)

## 2021-02-02 LAB — BASIC METABOLIC PANEL
Anion gap: 6 (ref 5–15)
BUN: 9 mg/dL (ref 8–23)
CO2: 28 mmol/L (ref 22–32)
Calcium: 8.5 mg/dL — ABNORMAL LOW (ref 8.9–10.3)
Chloride: 104 mmol/L (ref 98–111)
Creatinine, Ser: 0.69 mg/dL (ref 0.44–1.00)
GFR, Estimated: >60 mL/min (ref >60)
Glucose, Bld: 105 mg/dL — ABNORMAL HIGH (ref 70–99)
Potassium: 3.4 mmol/L — ABNORMAL LOW (ref 3.5–5.1)
Sodium: 138 mmol/L (ref 135–145)

## 2021-02-02 LAB — CBC
HCT: 32.1 % — ABNORMAL LOW (ref 36.0–46.0)
Hemoglobin: 10 g/dL — ABNORMAL LOW (ref 12.0–15.0)
MCH: 20.9 pg — ABNORMAL LOW (ref 26.0–34.0)
MCHC: 31.2 g/dL (ref 30.0–36.0)
MCV: 67.2 fL — ABNORMAL LOW (ref 80.0–100.0)
Platelets: 170 10*3/uL (ref 150–400)
RBC: 4.78 MIL/uL (ref 3.87–5.11)
RDW: 14.4 % (ref 11.5–15.5)
WBC: 6.2 10*3/uL (ref 4.0–10.5)
nRBC: 0 % (ref 0.0–0.2)

## 2021-02-02 NOTE — Patient Instructions (Addendum)
Your procedure is scheduled on: 02/09/21 Report to [place]. To find out your arrival time please call [phone] between 1PM - 3PM on 02/06/21.  Remember: Instructions that are not followed completely may result in serious medical risk, up to and including death, or upon the discretion of your surgeon and anesthesiologist your surgery may need to be rescheduled.     _X__ 1. Do not eat food or drink any liquids after midnight the night before your procedure.                 No gum chewing or hard candies.   __X__2.  On the morning of surgery brush your teeth with toothpaste and water, you                 may rinse your mouth with mouthwash if you wish.  Do not swallow any              toothpaste of mouthwash.     _X__ 3.  No Alcohol for 24 hours before or after surgery.   _X__ 4.  Do Not Smoke or use e-cigarettes For 24 Hours Prior to Your Surgery.                 Do not use any chewable tobacco products for at least 6 hours prior to                 surgery.  ____  5.  Bring all medications with you on the day of surgery if instructed.   __X__  6.  Notify your doctor if there is any change in your medical condition      (cold, fever, infections).     Do not wear jewelry, make-up, hairpins, clips or nail polish. Do not wear lotions, powders, or perfumes. No Deodorant Do not shave body hair 48 hours prior to surgery. Men may shave face and neck. Do not bring valuables to the hospital.    [facility] is not responsible for any belongings or valuables.  Contacts, dentures/partials or body piercings may not be worn into surgery. Bring a case for your contacts, glasses or hearing aids, a denture cup will be supplied. Leave your suitcase in the car. After surgery it may be brought to your room. For patients admitted to the hospital, discharge time is determined by your treatment team.   Patients discharged the day of surgery will  not be allowed to drive home.   Please read over the following fact sheets that you were given:     __X__ Take these medicines the morning of surgery with A SIP OF WATER:    1. ALPRAZolam (XANAX) 0.25 MG tablet if needed  2. tamoxifen (NOLVADEX) 20 MG tablet  3. traMADol (ULTRAM) 50 MG tablet if needed OR HYDROcodone-acetaminophen (NORCO) 10-325 MG tablet if needed  4.  5.  6.  ____ Fleet Enema (as directed)   __X__ Use CHG Soap/SAGE wipes as directed  ____ Use inhalers on the day of surgery  ____ Stop metformin/Janumet/Farxiga 2 days prior to surgery    ____ Take 1/2 of usual insulin dose the night before surgery. No insulin the morning          of surgery.   ____ Stop Blood Thinners Coumadin/Plavix/Xarelto/Pleta/Pradaxa/Eliquis/Effient/Aspirin  on   Or contact your Surgeon, Cardiologist or Medical Doctor regarding  ability to stop your blood thinners  __X__ Stop Anti-inflammatories 7 days before surgery such  as Advil, Ibuprofen, Motrin,  BC or Goodies Powder, Naprosyn, Naproxen, Aleve, Aspirin    __X__ Stop all herbal supplements, fish oil or vitamin E until after surgery.    ____ Bring C-Pap to the hospital.

## 2021-02-03 LAB — URINE CULTURE

## 2021-02-09 ENCOUNTER — Other Ambulatory Visit: Payer: Self-pay

## 2021-02-09 ENCOUNTER — Encounter
Admission: RE | Disposition: A | Discharge status: Home / Self Care | Payer: Self-pay | Source: Home / Self Care | Attending: Orthopedic Surgery

## 2021-02-09 ENCOUNTER — Encounter: Payer: Self-pay | Admitting: Orthopedic Surgery

## 2021-02-09 ENCOUNTER — Ambulatory Visit
Admission: RE | Admit: 2021-02-09 | Discharge: 2021-02-09 | Disposition: A | Discharge status: Home / Self Care | Payer: 59 | Attending: Orthopedic Surgery | Admitting: Orthopedic Surgery

## 2021-02-09 ENCOUNTER — Ambulatory Visit: Payer: 59

## 2021-02-09 ENCOUNTER — Ambulatory Visit: Payer: 59 | Admitting: Certified Registered"

## 2021-02-09 DIAGNOSIS — M7542 Impingement syndrome of left shoulder: Secondary | ICD-10-CM | POA: Insufficient documentation

## 2021-02-09 DIAGNOSIS — M94212 Chondromalacia, left shoulder: Secondary | ICD-10-CM | POA: Diagnosis not present

## 2021-02-09 DIAGNOSIS — Z419 Encounter for procedure for purposes other than remedying health state, unspecified: Secondary | ICD-10-CM

## 2021-02-09 DIAGNOSIS — M19012 Primary osteoarthritis, left shoulder: Secondary | ICD-10-CM | POA: Insufficient documentation

## 2021-02-09 DIAGNOSIS — M7522 Bicipital tendinitis, left shoulder: Secondary | ICD-10-CM | POA: Insufficient documentation

## 2021-02-09 DIAGNOSIS — Z87891 Personal history of nicotine dependence: Secondary | ICD-10-CM | POA: Diagnosis not present

## 2021-02-09 HISTORY — PX: SHOULDER ARTHROSCOPY WITH SUBACROMIAL DECOMPRESSION, ROTATOR CUFF REPAIR AND BICEP TENDON REPAIR: SHX5687

## 2021-02-09 SURGERY — SHOULDER ARTHROSCOPY WITH SUBACROMIAL DECOMPRESSION, ROTATOR CUFF REPAIR AND BICEP TENDON REPAIR
Anesthesia: Regional | Site: Shoulder | Laterality: Left

## 2021-02-09 MED ORDER — KETOROLAC TROMETHAMINE 15 MG/ML IJ SOLN
INTRAMUSCULAR | Status: AC
  Administered 2021-02-09: 15 mg via INTRAVENOUS
  Filled 2021-02-09: qty 1

## 2021-02-09 MED ORDER — PROPOFOL 10 MG/ML IV BOLUS
INTRAVENOUS | Status: DC | PRN
  Administered 2021-02-09: 100 mg via INTRAVENOUS

## 2021-02-09 MED ORDER — KETOROLAC TROMETHAMINE 15 MG/ML IJ SOLN: 15.0000 mg | Freq: Four times a day (QID) | INTRAMUSCULAR | Status: DC

## 2021-02-09 MED ORDER — CHLORHEXIDINE GLUCONATE 0.12 % MT SOLN
15.0000 mL | Freq: Once | OROMUCOSAL | Status: AC
  Administered 2021-02-09: 15 mL via OROMUCOSAL

## 2021-02-09 MED ORDER — FENTANYL CITRATE (PF) 100 MCG/2ML IJ SOLN: 25.0000 ug | INTRAMUSCULAR | Status: DC | PRN

## 2021-02-09 MED ORDER — MIDAZOLAM HCL 2 MG/2ML IJ SOLN
1.0000 mg | INTRAMUSCULAR | Status: AC | PRN
  Administered 2021-02-09: 2 mg via INTRAVENOUS

## 2021-02-09 MED ORDER — ONDANSETRON HCL 4 MG/2ML IJ SOLN
INTRAMUSCULAR | Status: DC | PRN
  Administered 2021-02-09: 4 mg via INTRAVENOUS

## 2021-02-09 MED ORDER — EPINEPHRINE PF 1 MG/ML IJ SOLN
INTRAMUSCULAR | Status: AC
  Filled 2021-02-09: qty 10

## 2021-02-09 MED ORDER — BUPIVACAINE LIPOSOME 1.3 % IJ SUSP
INTRAMUSCULAR | Status: DC | PRN
  Administered 2021-02-09 (×2): 5 mL

## 2021-02-09 MED ORDER — LACTATED RINGERS IR SOLN
Status: DC | PRN
  Administered 2021-02-09: 3000 mL

## 2021-02-09 MED ORDER — LIDOCAINE HCL (PF) 1 % IJ SOLN
INTRAMUSCULAR | Status: AC
  Filled 2021-02-09: qty 5

## 2021-02-09 MED ORDER — ORAL CARE MOUTH RINSE: 15.0000 mL | Freq: Once | OROMUCOSAL | Status: AC

## 2021-02-09 MED ORDER — LIDOCAINE HCL (PF) 1 % IJ SOLN
INTRAMUSCULAR | Status: DC | PRN
  Administered 2021-02-09: 1 mL

## 2021-02-09 MED ORDER — FENTANYL CITRATE PF 50 MCG/ML IJ SOSY
PREFILLED_SYRINGE | INTRAMUSCULAR | Status: AC
  Administered 2021-02-09: 50 ug via INTRAVENOUS
  Filled 2021-02-09: qty 1

## 2021-02-09 MED ORDER — EPHEDRINE 5 MG/ML INJ
INTRAVENOUS | Status: AC
  Filled 2021-02-09: qty 5

## 2021-02-09 MED ORDER — OXYCODONE HCL 5 MG/5ML PO SOLN: 5.0000 mg | Freq: Once | ORAL | Status: DC | PRN

## 2021-02-09 MED ORDER — LACTATED RINGERS IR SOLN
Status: DC | PRN
  Administered 2021-02-09 (×2): 3000 mL

## 2021-02-09 MED ORDER — OXYCODONE HCL 5 MG PO TABS: 5.0000 mg | ORAL_TABLET | Freq: Once | ORAL | Status: DC | PRN

## 2021-02-09 MED ORDER — DEXAMETHASONE SODIUM PHOSPHATE 10 MG/ML IJ SOLN
INTRAMUSCULAR | Status: DC | PRN
  Administered 2021-02-09: 10 mg via INTRAVENOUS

## 2021-02-09 MED ORDER — LACTATED RINGERS IV SOLN: INTRAVENOUS | Status: DC

## 2021-02-09 MED ORDER — CEFAZOLIN SODIUM-DEXTROSE 2-4 GM/100ML-% IV SOLN
INTRAVENOUS | Status: AC
  Filled 2021-02-09: qty 100

## 2021-02-09 MED ORDER — SUGAMMADEX SODIUM 200 MG/2ML IV SOLN
INTRAVENOUS | Status: DC | PRN
  Administered 2021-02-09: 200 mg via INTRAVENOUS

## 2021-02-09 MED ORDER — CEFAZOLIN SODIUM-DEXTROSE 2-4 GM/100ML-% IV SOLN
2.0000 g | INTRAVENOUS | Status: AC
  Administered 2021-02-09: 2 g via INTRAVENOUS

## 2021-02-09 MED ORDER — ROCURONIUM BROMIDE 100 MG/10ML IV SOLN
INTRAVENOUS | Status: DC | PRN
  Administered 2021-02-09: 50 mg via INTRAVENOUS
  Administered 2021-02-09: 20 mg via INTRAVENOUS

## 2021-02-09 MED ORDER — MIDAZOLAM HCL 2 MG/2ML IJ SOLN
INTRAMUSCULAR | Status: AC
  Filled 2021-02-09: qty 2

## 2021-02-09 MED ORDER — BUPIVACAINE LIPOSOME 1.3 % IJ SUSP
INTRAMUSCULAR | Status: AC
  Filled 2021-02-09: qty 10

## 2021-02-09 MED ORDER — MIDAZOLAM HCL 2 MG/2ML IJ SOLN
INTRAMUSCULAR | Status: AC
  Administered 2021-02-09: 2 mg via INTRAVENOUS
  Filled 2021-02-09: qty 2

## 2021-02-09 MED ORDER — OXYCODONE-ACETAMINOPHEN 10-325 MG PO TABS: 1.0000 | ORAL_TABLET | Freq: Four times a day (QID) | ORAL | 0 refills | Status: DC | PRN

## 2021-02-09 MED ORDER — CHLORHEXIDINE GLUCONATE 0.12 % MT SOLN
OROMUCOSAL | Status: AC
  Filled 2021-02-09: qty 15

## 2021-02-09 MED ORDER — SODIUM CHLORIDE (PF) 0.9 % IJ SOLN
INTRAMUSCULAR | Status: AC
  Filled 2021-02-09: qty 50

## 2021-02-09 MED ORDER — FAMOTIDINE 20 MG PO TABS
20.0000 mg | ORAL_TABLET | Freq: Once | ORAL | Status: DC
Start: 2021-02-09 — End: 2021-02-09

## 2021-02-09 MED ORDER — EPINEPHRINE PF 1 MG/ML IJ SOLN
INTRAMUSCULAR | Status: AC
  Filled 2021-02-09: qty 2

## 2021-02-09 MED ORDER — LIDOCAINE HCL (PF) 2 % IJ SOLN
INTRAMUSCULAR | Status: DC | PRN
  Administered 2021-02-09: 80 mg via INTRADERMAL

## 2021-02-09 MED ORDER — BUPIVACAINE HCL (PF) 0.5 % IJ SOLN
INTRAMUSCULAR | Status: AC
  Filled 2021-02-09: qty 10

## 2021-02-09 MED ORDER — BUPIVACAINE HCL (PF) 0.5 % IJ SOLN
INTRAMUSCULAR | Status: DC | PRN
  Administered 2021-02-09 (×2): 5 mL

## 2021-02-09 MED ORDER — BUPIVACAINE-EPINEPHRINE (PF) 0.25% -1:200000 IJ SOLN
INTRAMUSCULAR | Status: AC
  Filled 2021-02-09: qty 30

## 2021-02-09 MED ORDER — ONDANSETRON HCL 4 MG/2ML IJ SOLN: 4.0000 mg | Freq: Once | INTRAMUSCULAR | Status: DC | PRN

## 2021-02-09 MED ORDER — FENTANYL CITRATE PF 50 MCG/ML IJ SOSY: 50.0000 ug | PREFILLED_SYRINGE | INTRAMUSCULAR | Status: DC | PRN

## 2021-02-09 MED ORDER — DEXMEDETOMIDINE HCL IN NACL 400 MCG/100ML IV SOLN
INTRAVENOUS | Status: DC | PRN
  Administered 2021-02-09: 4 ug via INTRAVENOUS
  Administered 2021-02-09: 12 ug via INTRAVENOUS

## 2021-02-09 MED ORDER — ROPIVACAINE HCL 5 MG/ML IJ SOLN
INTRAMUSCULAR | Status: AC
  Filled 2021-02-09: qty 20

## 2021-02-09 SURGICAL SUPPLY — 67 items
ADAPTER IRRIG TUBE 2 SPIKE SOL (ADAPTER) ×4 IMPLANT
ADPR TBG 2 SPK PMP STRL ASCP (ADAPTER) ×2
APL PRP STRL LF DISP 70% ISPRP (MISCELLANEOUS) ×1
BLADE FULL RADIUS 3.5 (BLADE) ×2 IMPLANT
BLADE INCISOR PLUS 4.5 (BLADE) ×2 IMPLANT
BLADE SURG MINI STRL (BLADE) ×2 IMPLANT
BRUSH SCRUB EZ  4% CHG (MISCELLANEOUS) ×1
BRUSH SCRUB EZ 4% CHG (MISCELLANEOUS) ×1 IMPLANT
BUR ACROMIONIZER 4.0 (BURR) ×2 IMPLANT
BUR BR 5.5 WIDE MOUTH (BURR) IMPLANT
CANNULA SHOULDER 7CM (CANNULA) ×2 IMPLANT
CANNULA TWIST IN 8.25X7CM (CANNULA) IMPLANT
CHLORAPREP W/TINT 26 (MISCELLANEOUS) ×2 IMPLANT
COOLER POLAR GLACIER W/PUMP (MISCELLANEOUS) IMPLANT
DEVICE SUCT BLK HOLE OR FLOOR (MISCELLANEOUS) IMPLANT
DRAPE 3/4 80X56 (DRAPES) ×2 IMPLANT
DRAPE IMP U-DRAPE 54X76 (DRAPES) ×2 IMPLANT
DRAPE STERI 35X30 U-POUCH (DRAPES) ×2 IMPLANT
DRAPE U-SHAPE 47X51 STRL (DRAPES) ×2 IMPLANT
ELECT REM PT RETURN 9FT ADLT (ELECTROSURGICAL)
ELECTRODE REM PT RTRN 9FT ADLT (ELECTROSURGICAL) IMPLANT
GAUZE 4X4 16PLY ~~LOC~~+RFID DBL (SPONGE) IMPLANT
GAUZE SPONGE 4X4 12PLY STRL (GAUZE/BANDAGES/DRESSINGS) ×2 IMPLANT
GAUZE XEROFORM 1X8 LF (GAUZE/BANDAGES/DRESSINGS) ×2 IMPLANT
GLOVE SRG 8 PF TXTR STRL LF DI (GLOVE) ×1 IMPLANT
GLOVE SURG ORTHO LTX SZ8 (GLOVE) ×2 IMPLANT
GLOVE SURG UNDER POLY LF SZ8 (GLOVE) ×2
GOWN STRL REUS W/ TWL LRG LVL3 (GOWN DISPOSABLE) ×1 IMPLANT
GOWN STRL REUS W/ TWL XL LVL3 (GOWN DISPOSABLE) ×1 IMPLANT
GOWN STRL REUS W/TWL LRG LVL3 (GOWN DISPOSABLE) ×2
GOWN STRL REUS W/TWL XL LVL3 (GOWN DISPOSABLE) ×2
IV LACTATED RINGER IRRG 3000ML (IV SOLUTION) ×12
IV LR IRRIG 3000ML ARTHROMATIC (IV SOLUTION) ×6 IMPLANT
KIT STABILIZATION SHOULDER (MISCELLANEOUS) ×2 IMPLANT
KIT TURNOVER KIT A (KITS) ×2 IMPLANT
MANIFOLD NEPTUNE II (INSTRUMENTS) ×6 IMPLANT
MASK FACE SPIDER DISP (MASK) ×2 IMPLANT
MAT ABSORB  FLUID 56X50 GRAY (MISCELLANEOUS) ×1
MAT ABSORB FLUID 56X50 GRAY (MISCELLANEOUS) ×1 IMPLANT
NDL SAFETY ECLIPSE 18X1.5 (NEEDLE) ×1 IMPLANT
NEEDLE HYPO 18GX1.5 SHARP (NEEDLE) ×2
NEEDLE HYPO 22GX1.5 SAFETY (NEEDLE) ×2 IMPLANT
NEEDLE SCORPION MULTI FIRE (NEEDLE) IMPLANT
NEEDLE SPNL 18GX3.5 QUINCKE PK (NEEDLE) ×2 IMPLANT
PACK ARTHROSCOPY SHOULDER (MISCELLANEOUS) ×2 IMPLANT
PAD ABD DERMACEA PRESS 5X9 (GAUZE/BANDAGES/DRESSINGS) IMPLANT
PAD ARMBOARD 7.5X6 YLW CONV (MISCELLANEOUS) ×2 IMPLANT
PAD WRAPON POLAR SHDR XLG (MISCELLANEOUS) IMPLANT
SHEATH SHORT HANDLE 4.0 (SHEATH) ×2 IMPLANT
SLING ARM LRG DEEP (SOFTGOODS) IMPLANT
SLING ULTRA II M (MISCELLANEOUS) ×2 IMPLANT
SPONGE T-LAP 18X18 ~~LOC~~+RFID (SPONGE) ×2 IMPLANT
STRAP SAFETY 5IN WIDE (MISCELLANEOUS) ×2 IMPLANT
SUT ETHILON NAB PS2 4-0 18IN (SUTURE) ×2 IMPLANT
SUT FIBERWIRE #2 38 T-5 BLUE (SUTURE)
SUT PDS AB 0 CT1 27 (SUTURE) ×2 IMPLANT
SUT PROLENE 0 CT 2 (SUTURE) ×2 IMPLANT
SUT TIGER TAPE 7 IN WHITE (SUTURE) IMPLANT
SUTURE FIBERWR #2 38 T-5 BLUE (SUTURE) IMPLANT
SYR 10ML LL (SYRINGE) ×2 IMPLANT
SYR 50ML LL SCALE MARK (SYRINGE) ×2 IMPLANT
TAPE MICROFOAM 4IN (TAPE) ×2 IMPLANT
TUBING CONNECTING 10 (TUBING) ×2 IMPLANT
TUBING INFLOW SET DBFLO PUMP (TUBING) ×2 IMPLANT
WAND WEREWOLF FLOW 90D (MISCELLANEOUS) ×2 IMPLANT
WATER STERILE IRR 500ML POUR (IV SOLUTION) IMPLANT
WRAPON POLAR PAD SHDR XLG (MISCELLANEOUS)

## 2021-02-09 NOTE — Discharge Instructions (Addendum)
Wear sling at all times, including sleep.   Do not try and lift your arm up or away from your body for any reason.   Keep the dressing dry.  You may remove bandage in 3 days.  You may place Band-Aids over top of the incisions.  May shower once dressing is removed in 3 days.  Remove sling carefully only for showers, leaving arm down by your side while in the shower.  +++ Make sure to take some pain medication this evening before you fall asleep, in preparation for the nerve block wearing off in the middle of the night.  If the the pain medication causes itching, or is too strong, try taking a single tablet at a time, or combining with Benadryl.  You may be most comfortable sleeping in a recliner.  If you do sleep in near bed, placed pillows behind the shoulder that have the operation to support it.     AMBULATORY SURGERY  DISCHARGE INSTRUCTIONS   The drugs that you were given will stay in your system until tomorrow so for the next 24 hours you should not:  Drive an automobile Make any legal decisions Drink any alcoholic beverage   You may resume regular meals tomorrow.  Today it is better to start with liquids and gradually work up to solid foods.  You may eat anything you prefer, but it is better to start with liquids, then soup and crackers, and gradually work up to solid foods.   Please notify your doctor immediately if you have any unusual bleeding, trouble breathing, redness and pain at the surgery site, drainage, fever, or pain not relieved by medication.    Additional Instructions:  Information for Discharge Teaching:  DO NOT REMOVE TEAL BRACELET FOR 4 DAYS ( 96 hours) EXPAREL (bupivacaine liposome injectable suspension)   Your surgeon or anesthesiologist gave you EXPAREL(bupivacaine) to help control your pain after surgery.  EXPAREL is a local anesthetic that provides pain relief by numbing the tissue around the surgical site. EXPAREL is designed to release pain  medication over time and can control pain for up to 72 hours. Depending on how you respond to EXPAREL, you may require less pain medication during your recovery.  Possible side effects: Temporary loss of sensation or ability to move in the area where bupivacaine was injected. Nausea, vomiting, constipation Rarely, numbness and tingling in your mouth or lips, lightheadedness, or anxiety may occur. Call your doctor right away if you think you may be experiencing any of these sensations, or if you have other questions regarding possible side effects.  Follow all other discharge instructions given to you by your surgeon or nurse. Eat a healthy diet and drink plenty of water or other fluids.  If you return to the hospital for any reason within 96 hours following the administration of EXPAREL, it is important for health care providers to know that you have received this anesthetic. A teal colored band has been placed on your arm with the date, time and amount of EXPAREL you have received in order to alert and inform your health care providers. Please leave this armband in place for the full 96 hours following administration, and then you may remove the band.         Please contact your physician with any problems or Same Day Surgery at [phone], Monday through Friday 6 am to 4 pm, or [place] [facility] number at [phone].

## 2021-02-09 NOTE — Anesthesia Preprocedure Evaluation (Addendum)
Anesthesia Evaluation  Patient identified by MRN, date of birth, ID band Patient awake    Reviewed: Allergy & Precautions, H&P , NPO status , Patient's Chart, lab work & pertinent test results  History of Anesthesia Complications Negative for: history of anesthetic complications  Airway Mallampati: II  TM Distance: >3 FB Neck ROM: full    Dental no notable dental hx.    Pulmonary neg sleep apnea, neg COPD, former smoker,    Pulmonary exam normal        Cardiovascular (-) angina(-) Past MI and (-) Cardiac Stents negative cardio ROS Normal cardiovascular exam(-) dysrhythmias      Neuro/Psych  Headaches, PSYCHIATRIC DISORDERS Anxiety    GI/Hepatic negative GI ROS, Neg liver ROS,   Endo/Other  negative endocrine ROS  Renal/GU      Musculoskeletal  (+) Arthritis ,   Abdominal   Peds  Hematology negative hematology ROS (+)   Anesthesia Other Findings Breast CA  Reproductive/Obstetrics negative OB ROS                            Anesthesia Physical Anesthesia Plan  ASA: 2  Anesthesia Plan: General ETT and Regional   Post-op Pain Management: GA combined w/ Regional for post-op pain   Induction:   PONV Risk Score and Plan: Ondansetron, Dexamethasone, Midazolam and Treatment may vary due to age or medical condition  Airway Management Planned:   Additional Equipment:   Intra-op Plan:   Post-operative Plan:   Informed Consent: I have reviewed the patients History and Physical, chart, labs and discussed the procedure including the risks, benefits and alternatives for the proposed anesthesia with the patient or authorized representative who has indicated his/her understanding and acceptance.     Dental Advisory Given  Plan Discussed with: Anesthesiologist, CRNA and Surgeon  Anesthesia Plan Comments: (Pre-op interscalene block + GETA All risks/benefits discussed and pt consents to  anesthesia.)       Anesthesia Quick Evaluation

## 2021-02-09 NOTE — Anesthesia Postprocedure Evaluation (Signed)
Anesthesia Post Note  Patient: NEVENA ROZENBERG  Procedure(s) Performed: SHOULDER ARTHROSCOPY WITH SUBACROMIAL DECOMPRESSION (Left: Shoulder)  Patient location during evaluation: PACU Anesthesia Type: General Level of consciousness: awake and alert Pain management: pain level controlled Vital Signs Assessment: post-procedure vital signs reviewed and stable Respiratory status: spontaneous breathing, nonlabored ventilation and respiratory function stable Cardiovascular status: blood pressure returned to baseline and stable Postop Assessment: no apparent nausea or vomiting Anesthetic complications: no   No notable events documented.   Last Vitals:  Vitals:   02/09/21 1445 02/09/21 1500  BP: (!) 157/83 (!) 158/74  Pulse: 68 69  Resp: 14 17  Temp:    SpO2: 97% 98%    Last Pain:  Vitals:   02/09/21 1500  TempSrc:   PainSc: 0-No pain                 Brett Canales Pearlie Lafosse

## 2021-02-09 NOTE — Anesthesia Procedure Notes (Signed)
Procedure Name: Intubation Date/Time: 02/09/2021 12:50 PM Performed by: Kerri Perches, CRNA Pre-anesthesia Checklist: Patient identified, Patient being monitored, Timeout performed, Emergency Drugs available and Suction available Patient Re-evaluated:Patient Re-evaluated prior to induction Oxygen Delivery Method: Circle system utilized Preoxygenation: Pre-oxygenation with 100% oxygen Induction Type: IV induction Ventilation: Mask ventilation without difficulty Laryngoscope Size: 3 and McGraph Grade View: Grade I Tube type: Oral Tube size: 7.0 mm Number of attempts: 1 Airway Equipment and Method: Stylet Placement Confirmation: ETT inserted through vocal cords under direct vision, positive ETCO2 and breath sounds checked- equal and bilateral Secured at: 21 cm Tube secured with: Tape Dental Injury: Teeth and Oropharynx as per pre-operative assessment

## 2021-02-09 NOTE — Transfer of Care (Signed)
Immediate Anesthesia Transfer of Care Note  Patient: Erin Stevens  Procedure(s) Performed: SHOULDER ARTHROSCOPY WITH SUBACROMIAL DECOMPRESSION (Left: Shoulder)  Patient Location: PACU  Anesthesia Type:General  Level of Consciousness: awake  Airway & Oxygen Therapy: Patient Spontanous Breathing  Post-op Assessment: Report given to RN  Post vital signs: stable  Last Vitals:  Vitals Value Taken Time  BP 155/78 02/09/21 1427  Temp 36.2 C 02/09/21 1427  Pulse 73 02/09/21 1429  Resp 17 02/09/21 1429  SpO2 88 % 02/09/21 1429  Vitals shown include unvalidated device data.  Last Pain:  Vitals:   02/09/21 1010  TempSrc: Temporal  PainSc: 0-No pain         Complications: No notable events documented.

## 2021-02-09 NOTE — Op Note (Signed)
02/09/2021  2:09 PM  PATIENT:  Erin Stevens    PRE-OPERATIVE DIAGNOSIS:  AC ARTHRITIS, OUTLET IMPINGEMENT, CHONDROMALACIA, BICEPS TENDINITIS LEFT SHOULDER  POST-OPERATIVE DIAGNOSIS:  Same  PROCEDURE:  LEFT SHOULDER ARTHROSCOPY WITH DISTAL CLAVICLE EXCISION, SUBACROMIAL DECOMPRESSION, EXTENSIVE INTRA-ARTICULAR DEBRIDEMENT   SURGEON:  Lovell Sheehan, MD  ASSIST: Carlynn Spry, PA-C  ANESTHESIA:   General  PREOPERATIVE INDICATIONS:  Erin Stevens is a  60 y.o. female with above diagnosis who failed conservative measures and elected for surgical management.    I discussed the risks and benefits of surgery. The risks include but are not limited to infection, bleeding requiring blood transfusion, nerve or blood vessel injury, joint stiffness or loss of motion, persistent pain, weakness or instability, malunion, nonunion and hardware failure and the need for further surgery. Medical risks include but are not limited to DVT and pulmonary embolism, myocardial infarction, stroke, pneumonia, respiratory failure and death. Patient understood these risks and wished to proceed.   OPERATIVE FINDINGS: The biceps tendon origin had extensive synovitis and degenerative fraying. There was mild articular sided fraying of the infraspinatus but no evidence of high-grade partial or full-thickness tear. Patient had a severe subacromial bursitis and advanced acromioclavicular joint arthrosis. 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  OPERATIVE PROCEDURE: The patient was met in the preoperative area. The left shoulder was signed with the word yes and my initials according the hospital's correct site of surgery protocol.  History and physical was updated. Patient was brought to the operating room where she underwent interscalene block and general anesthesia. The patient was placed in a beachchair position.  A spider arm positioner was used for this  case. Examination under anesthesia revealed no instability with load shift testing. The patient had a negative sulcus sign. There was limited forward flexion and abduction which resolved upon manipulation.  Patient was prepped and draped in a sterile fashion. A timeout was performed to verify the patient's name, date of birth, medical record number, correct site of surgery and correct procedure to be performed there was also used to verify the patient received antibiotics that all appropriate instruments, implants and radiographs studies were available in the room. Once all in attendance were in agreement case began.  Bony landmarks were drawn out with a surgical marker along with proposed arthroscopy incisions. These were pre-injected with 1% lidocaine plain. An 11 blade was used to establish a posterior portal through which the arthroscope was placed in the glenohumeral joint. A full diagnostic examination of the shoulder was performed. Please see findings for a complete report. A biceps tenotomy was then performed with electrocautery and the synovitis, degenerative labrum and biceps stump were debrided with the shaver. Next a chondroplasty was performed of the glenoid and humeral head using the full radius shaver. All loose cartilage flaps were debrided to a stable border and verified with the probe.  The arthroscope was then placed in the subacromial space.  Extensive bursitis was encountered. A lateral portal was established with an 18-gauge spinal needle for localization. A 90 ArthroCare wand and shaver blade were used to form an extensive subdeltoid and subacromial bursectomy. There was no evidence of a bursal sided tear of the supra or infraspinatus.   A subacromial decompression was performed using a 4.0 mm Acromionizer from the lateral portal. The 4.0 mm Acromionizer was then placed through the anterior portal.  A distal clavicle excision was performed with 8 mm of  distal clavicle removed.  Subacromial space was then copiously irrigated to remove all osseous debris. Final arthroscopic images were taken. Arthroscopic images were then removed.  Skin closure for the arthroscopic incisions was performed with 4-0 nylon.   0.25% Marcaine plain was then injected into the subacromial space for postoperative pain control. A dry sterile dressing was applied.  The patient was placed in a sling.  All sharp and it instrument counts were correct at the conclusion of the case. I was scrubbed and present for the entire case. I spoke with the patient's family postoperatively to let them know the case had been performed without complication and the patient was stable in recovery room.

## 2021-02-09 NOTE — H&P (Signed)
The patient has been re-examined, and the chart reviewed, and there have been no interval changes to the documented history and physical.  Plan a left shoulder scope today.  Anesthesia is consulted regarding a peripheral nerve block for post-operative pain.  The risks, benefits, and alternatives have been discussed at length, and the patient is willing to proceed.    

## 2021-02-09 NOTE — Anesthesia Procedure Notes (Signed)
Anesthesia Regional Block: Interscalene brachial plexus block   Pre-Anesthetic Checklist: , timeout performed,  Correct Patient, Correct Site, Correct Laterality,  Correct Procedure, Correct Position, site marked,  Risks and benefits discussed,  Surgical consent,  Pre-op evaluation,  At surgeon's request and post-op pain management  Laterality: Left  Prep: chloraprep       Needles:  Injection technique: Single-shot  Needle Type: Stimiplex     Needle Length: 9cm  Needle Gauge: 20     Additional Needles:   Procedures:,,,, ultrasound used (permanent image in chart),,    Narrative:  Start time: 02/09/2021 12:30 PM End time: 02/09/2021 12:35 PM  Performed by: Personally  Anesthesiologist: Tera Mater, MD  Additional Notes: Pt tolerated the procedure well without apparent complications.

## 2021-02-10 ENCOUNTER — Encounter: Payer: Self-pay | Admitting: Orthopedic Surgery

## 2021-04-02 ENCOUNTER — Encounter: Payer: Self-pay | Admitting: Oncology

## 2021-04-02 ENCOUNTER — Inpatient Hospital Stay: Payer: 59 | Attending: Oncology | Admitting: Nurse Practitioner

## 2021-04-02 ENCOUNTER — Telehealth: Payer: Self-pay | Admitting: Nurse Practitioner

## 2021-04-02 NOTE — Telephone Encounter (Signed)
Left VM with patient in regards to missed appointment today. Requested she call back to get rescheduled.

## 2021-04-12 ENCOUNTER — Encounter: Payer: Self-pay | Admitting: Oncology

## 2021-04-30 ENCOUNTER — Other Ambulatory Visit: Payer: Self-pay | Admitting: Family Medicine

## 2021-04-30 DIAGNOSIS — Z1231 Encounter for screening mammogram for malignant neoplasm of breast: Secondary | ICD-10-CM

## 2021-05-11 ENCOUNTER — Other Ambulatory Visit: Payer: Self-pay | Admitting: Oncology

## 2021-05-29 ENCOUNTER — Inpatient Hospital Stay: Payer: 59 | Admitting: Nurse Practitioner

## 2021-06-09 ENCOUNTER — Other Ambulatory Visit: Payer: Self-pay

## 2021-06-09 ENCOUNTER — Ambulatory Visit
Admission: RE | Admit: 2021-06-09 | Discharge: 2021-06-09 | Disposition: A | Discharge status: Home / Self Care | Payer: 59 | Source: Ambulatory Visit | Attending: Family Medicine | Admitting: Family Medicine

## 2021-06-09 DIAGNOSIS — Z1231 Encounter for screening mammogram for malignant neoplasm of breast: Secondary | ICD-10-CM | POA: Diagnosis not present

## 2021-06-12 ENCOUNTER — Other Ambulatory Visit: Payer: Self-pay | Admitting: Family Medicine

## 2021-06-12 DIAGNOSIS — R928 Other abnormal and inconclusive findings on diagnostic imaging of breast: Secondary | ICD-10-CM

## 2021-06-12 DIAGNOSIS — N6489 Other specified disorders of breast: Secondary | ICD-10-CM

## 2021-06-17 DIAGNOSIS — M19012 Primary osteoarthritis, left shoulder: Secondary | ICD-10-CM | POA: Diagnosis not present

## 2021-06-17 DIAGNOSIS — Z9889 Other specified postprocedural states: Secondary | ICD-10-CM | POA: Diagnosis not present

## 2021-06-26 ENCOUNTER — Ambulatory Visit
Admission: RE | Admit: 2021-06-26 | Discharge: 2021-06-26 | Disposition: A | Discharge status: Home / Self Care | Payer: 59 | Source: Ambulatory Visit | Attending: Family Medicine | Admitting: Family Medicine

## 2021-06-26 ENCOUNTER — Other Ambulatory Visit: Payer: Self-pay

## 2021-06-26 DIAGNOSIS — R928 Other abnormal and inconclusive findings on diagnostic imaging of breast: Secondary | ICD-10-CM | POA: Diagnosis not present

## 2021-06-26 DIAGNOSIS — R922 Inconclusive mammogram: Secondary | ICD-10-CM | POA: Diagnosis not present

## 2021-06-26 DIAGNOSIS — N6489 Other specified disorders of breast: Secondary | ICD-10-CM | POA: Diagnosis not present

## 2021-07-19 ENCOUNTER — Other Ambulatory Visit: Payer: Self-pay | Admitting: Oncology

## 2021-07-20 ENCOUNTER — Other Ambulatory Visit: Payer: Self-pay | Admitting: *Deleted

## 2021-07-20 ENCOUNTER — Encounter: Payer: Self-pay | Admitting: Oncology

## 2021-07-20 MED ORDER — TAMOXIFEN CITRATE 20 MG PO TABS: ORAL_TABLET | ORAL | 0 refills | Status: DC

## 2021-07-20 NOTE — Telephone Encounter (Signed)
Has mad an appointment for Monday 4/24, will send 30 supply of Tamoxifen ?

## 2021-07-22 DIAGNOSIS — D649 Anemia, unspecified: Secondary | ICD-10-CM | POA: Diagnosis not present

## 2021-07-22 DIAGNOSIS — Z0000 Encounter for general adult medical examination without abnormal findings: Secondary | ICD-10-CM | POA: Diagnosis not present

## 2021-07-22 DIAGNOSIS — G8929 Other chronic pain: Secondary | ICD-10-CM | POA: Diagnosis not present

## 2021-07-22 DIAGNOSIS — Z862 Personal history of diseases of the blood and blood-forming organs and certain disorders involving the immune mechanism: Secondary | ICD-10-CM | POA: Diagnosis not present

## 2021-07-22 DIAGNOSIS — M5442 Lumbago with sciatica, left side: Secondary | ICD-10-CM | POA: Diagnosis not present

## 2021-07-22 DIAGNOSIS — R69 Illness, unspecified: Secondary | ICD-10-CM | POA: Diagnosis not present

## 2021-07-23 DIAGNOSIS — Z853 Personal history of malignant neoplasm of breast: Secondary | ICD-10-CM | POA: Diagnosis not present

## 2021-07-27 ENCOUNTER — Inpatient Hospital Stay: Payer: 59 | Attending: Nurse Practitioner | Admitting: Nurse Practitioner

## 2021-07-27 ENCOUNTER — Encounter: Payer: Self-pay | Admitting: Nurse Practitioner

## 2021-07-27 VITALS — HR 78 | Temp 97.2°F | Resp 16 | Ht 64.0 in | Wt 139.6 lb

## 2021-07-27 DIAGNOSIS — Z5181 Encounter for therapeutic drug level monitoring: Secondary | ICD-10-CM

## 2021-07-27 DIAGNOSIS — Z170 Estrogen receptor positive status [ER+]: Secondary | ICD-10-CM | POA: Diagnosis not present

## 2021-07-27 DIAGNOSIS — M8580 Other specified disorders of bone density and structure, unspecified site: Secondary | ICD-10-CM

## 2021-07-27 DIAGNOSIS — Z79810 Long term (current) use of selective estrogen receptor modulators (SERMs): Secondary | ICD-10-CM

## 2021-07-27 DIAGNOSIS — Z923 Personal history of irradiation: Secondary | ICD-10-CM | POA: Diagnosis not present

## 2021-07-27 DIAGNOSIS — C50411 Malignant neoplasm of upper-outer quadrant of right female breast: Secondary | ICD-10-CM | POA: Insufficient documentation

## 2021-07-27 DIAGNOSIS — Z08 Encounter for follow-up examination after completed treatment for malignant neoplasm: Secondary | ICD-10-CM | POA: Diagnosis not present

## 2021-07-27 DIAGNOSIS — Z79811 Long term (current) use of aromatase inhibitors: Secondary | ICD-10-CM | POA: Insufficient documentation

## 2021-07-27 DIAGNOSIS — Z853 Personal history of malignant neoplasm of breast: Secondary | ICD-10-CM | POA: Diagnosis not present

## 2021-07-27 NOTE — Progress Notes (Signed)
?[facility]  ?[phone] [phone] ? ?ID: Erin Stevens OB: [birth year]  MR#: [id number]  UYE#:[id number] ? ?Patient Care Team: ?[name], MD as PCP - General (Family Medicine) ?Lloyd Huger, MD as Consulting Physician (Oncology) ?  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 Oncotype DX 24, intermediate risk. ? ?INTERVAL HISTORY: Patient returns to clinic today for routine 22-monthevaluation. She continues to take tamoxifen and is tolerating treatment well without significant side effects. She is scheduled to complete 5 years of endocrine therapy in July 2023. Is nervous about stopping medication. Continues to have arthritic joint pains and worries her bone density is worse. Performs breast exams and denies lumps or bumps. She has no neurologic complaints. She denies any recent fevers or illnesses. She has a good appetite and denies weight loss. She denies any chest pain, shortness of breath, cough, or hemoptysis. She denies any nausea, vomiting, constipation, or diarrhea. She has no urinary complaints. Patient offers no specific complaints today. Denies vaginal bleeding or spotting.  ? ?REVIEW OF SYSTEMS:   ?Review of Systems  ?Constitutional:  Negative for diaphoresis, fever, malaise/fatigue and weight loss.  ?Respiratory:  Negative for cough and shortness of breath.   ?Cardiovascular:  Negative for chest pain and leg swelling.  ?Gastrointestinal: Negative.  Negative for abdominal pain.  ?Genitourinary: Negative.  Negative for dysuria.  ?Musculoskeletal:  Positive for joint pain. Negative for myalgias.  ?Skin:  Negative for rash.  ?Neurological:  Negative for sensory change, focal weakness, weakness and headaches.  ?Psychiatric/Behavioral:  Negative for depression. The patient is not nervous/anxious.   ?As per HPI. Otherwise, a complete review of  systems is negative. ? ?PAST MEDICAL HISTORY: ?Past Medical History:  ?Diagnosis Date  ? Anemia   ? Anxiety   ? Arthritis   ? LOWER BACK  ? [place]  ? right breast INVASIVE MAMMARY CARCINOMA  ? Cervical dysplasia   ? DDD (degenerative disc disease), lumbar   ? Personal history of radiation therapy 2018  ? right breast ca  ? ? ?PAST SURGICAL HISTORY: ?Past Surgical History:  ?Procedure Laterality Date  ? BREAST BIOPSY Right 06/11/2016  ? INVASIVE MAMMARY CARCINOMA  ? BREAST LUMPECTOMY Right 07/08/2016  ? INVASIVE MAMMARY CARCINOMA, clear margins, negative LN  ? CERVIX SURGERY    ? PARTIAL MASTECTOMY WITH NEEDLE LOCALIZATION Right 07/08/2016  ? Procedure: PARTIAL MASTECTOMY WITH NEEDLE LOCALIZATION;  Surgeon: JLeonie Green MD;  Location: [facility] ORS;  Service: General;  Laterality: Right;  ? SENTINEL NODE BIOPSY Right 07/08/2016  ? Procedure: SENTINEL NODE BIOPSY;  Surgeon: JLeonie Green MD;  Location: [facility] ORS;  Service: General;  Laterality: Right;  ? SHOULDER ARTHROSCOPY WITH SUBACROMIAL DECOMPRESSION, ROTATOR CUFF REPAIR AND BICEP TENDON REPAIR Left 02/09/2021  ? Procedure: SHOULDER ARTHROSCOPY WITH SUBACROMIAL DECOMPRESSION;  Surgeon: BLovell Sheehan MD;  Location: [facility] ORS;  Service: Orthopedics;  Laterality: Left;  ? WISDOM TOOTH EXTRACTION    ? 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  ? ?FAMILY HISTORY: ?Family History  ?Problem Relation Age of Onset  ? Breast cancer Neg Hx   ? ?ADVANCED DIRECTIVES (Y/N):  N ? ?HEALTH MAINTENANCE: ?Social History  ? ?Tobacco Use  ? Smoking status: Former  ?  Years: 2.00  ?  Types: Cigarettes  ?  Quit date: 07/01/1996  ?  Years since quitting: 25.0  ? Smokeless tobacco: Never  ? Tobacco comments:  ?  ONLY WEEKEND SMOKER  ?Vaping Use  ? Vaping Use: Never used  ?Substance Use Topics  ? Alcohol use: No  ? Drug use: No  ? ? Colonoscopy: ? PAP: ? Bone density: ? Lipid panel: ? ?No Known  Allergies ? ?Current Outpatient Medications  ?Medication Sig Dispense Refill  ? acetaminophen (TYLENOL) 500 MG tablet Take 1,000 mg by mouth every 6 (six) hours as needed for moderate pain or headache.    ? ALPRAZolam (XANAX) 0.25 MG tablet Take 1 tablet by mouth daily as needed for anxiety.    ? citalopram (CELEXA) 20 MG tablet Take 20 mg by mouth at bedtime.    ? tamoxifen (NOLVADEX) 20 MG tablet TAKE ONE TABLET BY MOUTH DAILY. Patient MUST keep appointment for 4/24 for any future refills to be given 30 tablet 0  ? traMADol (ULTRAM) 50 MG tablet Take 50 mg by mouth 2 (two) times daily as needed for moderate pain.    ? zolpidem (AMBIEN) 10 MG tablet Take 10 mg by mouth at bedtime as needed for sleep.    ? ?No current facility-administered medications for this visit.  ? ? ?OBJECTIVE: ?Vitals:  ? 07/27/21 1254  ?Pulse: 78  ?Resp: 16  ?Temp: (!) 97.2 ?F (36.2 ?C)  ?SpO2: 99%  ?   Body mass index is 23.96 kg/m?Marland Kitchen    ECOG FS:0 - Asymptomatic ? ?General: Well-developed, well-nourished, no acute distress. ?Eyes: Pink conjunctiva, anicteric sclera. ?HEENT: Normocephalic, moist mucous membranes. ?Lungs: No audible wheezing or coughing. ?Heart: Regular rate and rhythm. ?Abdomen: Soft, nontender, no obvious distention. ?Musculoskeletal: No edema, cyanosis, or clubbing. ?Neuro: Alert, answering all questions appropriately. Cranial nerves grossly intact. ?Skin: No rashes or petechiae noted. ?Psych: Normal affect. ?Breast: declined ? ?LAB RESULTS: ? ?Lab Results  ?Component Value Date  ? NA 138 02/02/2021  ? K 3.4 (L) 02/02/2021  ? CL 104 02/02/2021  ? CO2 28 02/02/2021  ? GLUCOSE 105 (H) 02/02/2021  ? BUN 9 02/02/2021  ? CREATININE 0.69 02/02/2021  ? CALCIUM 8.5 (L) 02/02/2021  ? GFRNONAA >60 02/02/2021  ? ?Lab Results  ?Component Value Date  ? WBC 6.2 02/02/2021  ? HGB 10.0 (L) 02/02/2021  ? HCT 32.1 (L) 02/02/2021  ? MCV 67.2 (L) 02/02/2021  ? PLT 170 02/02/2021  ? ?STUDIES: ?No results found. ? 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 Oncotype DX 24, intermediate risk. ? ?PLAN:   ? ?1.  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  Because patient's Oncotype score was intermediate risk, she was offered chemotherapy which she declined. Given the small size of her tumor, it was agreed upon that was likely not necessary. Patient completed adjuvant XRT.  Intolerant to  letrozole and anastrozole secondary to side effects. Continue tamoxifen for a total of 5 years of treatment completing in approximately July 2023.  Given patient's intermediate risk, she may benefit from extended treatment for 7 to 10 years. Reviewed BCI testing given her anxiety about stopping vs continuing. She would like me to send BCI. Her most recent mammogram on June 09, 2021 was reported as BI-RADS 0 and was followed up with left diagnostic mammogram which was negative for malignancy.  Repeat in March 2024. She is nearly 5 years from  diagnosis, reviewed transition to annual surveillance. She elected to return in 9 months at which point if she remains asymptomatic, can consider annual visits. Last pelvic exam was 2021 with Dr. Leafy Ro. She denies vaginal bleeding or spotting. Caution given use of tamoxifen.  ? ?2.  Osteoporosis:  ?T scores:  ?11/23/2016 -2.3  ?12/22/2017-  -2.4 ?01/31/19-  -3.1 ?02/04/20-  -2.3 ?Patient states that she takes calcium and vitamin D supplementation. Suffered arthralgias and myalgias with zometa and now declines any further Zometa or bisphosphonates. Reviewed that tamoxifen may improve bone density. Has not had 2022 bone density. Will schedule.  ? ?Disposition: ?BCI testing ?Bone density ?9 mo- Dr. Grayland Ormond or NP for breast cancer surveillance- [place] ? ?Patient expressed understanding and was in agreement with this plan. She also understands  that She can call clinic at any time with any questions, concerns, or complaints.  ? ? Cancer Staging  ?Primary cancer of upper outer quadrant of right female breast ([place]) ?Staging form: Breast, AJCC 8th Editio

## 2021-07-27 NOTE — Progress Notes (Signed)
Pt reports left shoulder pain d/t recent surgery and chronic back pain. No concerns at this time  ?

## 2021-07-28 NOTE — Progress Notes (Signed)
Breast Cancer Index requisition and necessary paperwork faxed. Receipt confirmation received.  ?

## 2021-08-04 ENCOUNTER — Other Ambulatory Visit: Payer: Self-pay | Admitting: Emergency Medicine

## 2021-08-04 ENCOUNTER — Telehealth: Payer: Self-pay | Admitting: *Deleted

## 2021-08-04 DIAGNOSIS — Z79811 Long term (current) use of aromatase inhibitors: Secondary | ICD-10-CM

## 2021-08-04 NOTE — Telephone Encounter (Signed)
Returned call to patient. Left VM stating that someone will be reaching out to her regarding scheduling her bone density scan and that the BCI testing has been sent off and we are waiting on results.  ?

## 2021-08-04 NOTE — Telephone Encounter (Signed)
Patient called asking if her BMD has been scheduled yet. She is also asking if we have sent tissue for testing and gotten insurance approval for it. 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

## 2021-08-05 ENCOUNTER — Encounter: Payer: Self-pay | Admitting: Oncology

## 2021-08-12 ENCOUNTER — Other Ambulatory Visit: Payer: Self-pay | Admitting: Oncology

## 2021-08-12 NOTE — Telephone Encounter (Signed)
Spoke with representative of BCI testing on 08/04/21. Order and required faxed information received. Waiting on billing information prior to proceeding with testing.  ?

## 2021-08-20 ENCOUNTER — Encounter: Payer: Self-pay | Admitting: Nurse Practitioner

## 2021-08-21 ENCOUNTER — Telehealth: Payer: Self-pay | Admitting: Nurse Practitioner

## 2021-08-21 DIAGNOSIS — C50411 Malignant neoplasm of upper-outer quadrant of right female breast: Secondary | ICD-10-CM

## 2021-08-21 NOTE — Telephone Encounter (Signed)
Left vm for patient to review results of BCI testing. Left vm to return call.

## 2021-09-02 ENCOUNTER — Telehealth: Payer: Self-pay | Admitting: Nurse Practitioner

## 2021-09-02 NOTE — Telephone Encounter (Signed)
Second attempt to discuss BCI results. No answer. Left vm.

## 2021-09-12 ENCOUNTER — Other Ambulatory Visit: Payer: Self-pay | Admitting: Oncology

## 2021-10-09 ENCOUNTER — Other Ambulatory Visit: Payer: Self-pay | Admitting: Oncology

## 2021-10-12 ENCOUNTER — Other Ambulatory Visit: Payer: Self-pay | Admitting: *Deleted

## 2021-10-12 MED ORDER — TAMOXIFEN CITRATE 20 MG PO TABS: 20.0000 mg | ORAL_TABLET | Freq: Every day | ORAL | 3 refills | Status: DC

## 2021-10-12 NOTE — Telephone Encounter (Signed)
Patient called requesting refill for Tamoxifen. It looks like per note from last visit she would complete therapy in July 2023 but there was discussion of continuing 7-10 years. Please advise if refill is appropriate.

## 2021-10-13 ENCOUNTER — Encounter: Payer: Self-pay | Admitting: Oncology

## 2021-11-20 ENCOUNTER — Telehealth: Payer: Self-pay | Admitting: Nurse Practitioner

## 2021-11-20 NOTE — Telephone Encounter (Signed)
Who do I send info to about this pt getting a bill in a mail from testing that was done? She called and said she got a bill in the mail for over $3,000 and doesn't look like insurance was contacted about it.

## 2021-11-20 NOTE — Telephone Encounter (Signed)
This pt called and stated she wanted to speak with you about her sample that was sent to [place]?

## 2022-01-06 ENCOUNTER — Ambulatory Visit: Payer: 59 | Admitting: Radiation Oncology

## 2022-01-14 ENCOUNTER — Ambulatory Visit: Payer: 59 | Admitting: Radiation Oncology

## 2022-02-08 ENCOUNTER — Other Ambulatory Visit: Payer: 59

## 2022-03-05 ENCOUNTER — Encounter: Payer: Self-pay | Admitting: Oncology

## 2022-03-10 ENCOUNTER — Encounter: Payer: Self-pay | Admitting: Radiation Oncology

## 2022-03-10 ENCOUNTER — Ambulatory Visit
Admission: RE | Admit: 2022-03-10 | Discharge: 2022-03-10 | Disposition: A | Discharge status: Home / Self Care | Payer: 59 | Source: Ambulatory Visit | Attending: Radiation Oncology | Admitting: Radiation Oncology

## 2022-03-10 VITALS — BP 141/69 | HR 104 | Temp 98.2°F | Resp 20 | Ht 64.0 in | Wt 152.0 lb

## 2022-03-10 DIAGNOSIS — Z79810 Long term (current) use of selective estrogen receptor modulators (SERMs): Secondary | ICD-10-CM | POA: Diagnosis not present

## 2022-03-10 DIAGNOSIS — C50411 Malignant neoplasm of upper-outer quadrant of right female breast: Secondary | ICD-10-CM

## 2022-03-10 DIAGNOSIS — Z170 Estrogen receptor positive status [ER+]: Secondary | ICD-10-CM | POA: Insufficient documentation

## 2022-03-10 DIAGNOSIS — Z923 Personal history of irradiation: Secondary | ICD-10-CM | POA: Insufficient documentation

## 2022-03-10 NOTE — Progress Notes (Signed)
Radiation Oncology Follow up Note  Name: Erin Stevens   Date:   03/10/2022 MRN:  [id number] DOB: 13-Aug-1960    This 61 y.o. female presents to the clinic today for 5-year follow-up status post whole breast radiation to her right breast for stage I ER/PR positive invasive mammary carcinoma.  REFERRING PROVIDER: [name], MD  HPI: Patient is a 61 year old female now out 5 years having completed whole breast radiation to her right breast for stage I ER/PR positive asymmetric carcinoma.  Seen today in routine follow-up she is doing well.  She specifically denies breast tenderness cough or bone pain..  She had mammograms which I have reviewed back in March which were BI-RADS 1 negative.  She is currently on tamoxifen and recently had test results indicating she would benefit from continuation of tamoxifen therapy.  COMPLICATIONS OF TREATMENT: none  FOLLOW UP COMPLIANCE: keeps appointments   PHYSICAL EXAM:  BP (!) 141/69 (BP Location: Left Arm, Patient Position: Sitting, Cuff Size: Normal)   Pulse (!) 104   Temp 98.2 F (36.8 C) (Tympanic)   Resp 20   Ht '5\' 4"'$  (1.626 m)   Wt 152 lb (68.9 kg)   BMI 26.09 kg/m  Lungs are clear to A&P cardiac examination essentially unremarkable with regular rate and rhythm. No dominant mass or nodularity is noted in either breast in 2 positions examined. Incision is well-healed. No axillary or supraclavicular adenopathy is appreciated. Cosmetic result is excellent.  Well-developed well-nourished patient in NAD. HEENT reveals PERLA, EOMI, discs not visualized.  Oral cavity is clear. No oral mucosal lesions are identified. Neck is clear without evidence of cervical or supraclavicular adenopathy. Lungs are clear to A&P. Cardiac examination is essentially unremarkable with regular rate and rhythm without murmur rub or thrill. Abdomen is benign with no organomegaly or masses noted. Motor sensory and DTR levels are equal and symmetric in the upper and lower  extremities. Cranial nerves II through XII are grossly intact. Proprioception is intact. No peripheral adenopathy or edema is identified. No motor or sensory levels are noted. Crude visual fields are within normal range.  RADIOLOGY RESULTS: Mammogram reviewed compatible with above-stated findings  PLAN: Present time patient is now at 5 years with no evidence of disease.  I am going to discontinue follow-up care.  I be happy to reevaluate the patient the future should that be indicated.  She continues follow-up care with medical oncology.  Patient knows to call with any concerns.  I would like to take this opportunity to thank you for allowing me to participate in the care of your patient.Noreene Filbert, MD

## 2022-04-12 ENCOUNTER — Ambulatory Visit
Admission: RE | Admit: 2022-04-12 | Discharge: 2022-04-12 | Disposition: A | Discharge status: Home / Self Care | Payer: 59 | Source: Ambulatory Visit | Attending: Oncology | Admitting: Oncology

## 2022-04-12 DIAGNOSIS — Z79811 Long term (current) use of aromatase inhibitors: Secondary | ICD-10-CM | POA: Diagnosis present

## 2022-04-12 DIAGNOSIS — Z13820 Encounter for screening for osteoporosis: Secondary | ICD-10-CM | POA: Insufficient documentation

## 2022-04-12 DIAGNOSIS — Z780 Asymptomatic menopausal state: Secondary | ICD-10-CM | POA: Diagnosis not present

## 2022-04-29 ENCOUNTER — Encounter: Payer: Self-pay | Admitting: Oncology

## 2022-04-29 ENCOUNTER — Other Ambulatory Visit: Payer: Self-pay | Admitting: *Deleted

## 2022-04-29 ENCOUNTER — Inpatient Hospital Stay: Payer: 59 | Attending: Oncology | Admitting: Oncology

## 2022-04-29 VITALS — BP 138/83 | HR 78 | Temp 98.9°F | Resp 16 | Ht 64.0 in | Wt 150.0 lb

## 2022-04-29 DIAGNOSIS — Z79811 Long term (current) use of aromatase inhibitors: Secondary | ICD-10-CM | POA: Diagnosis not present

## 2022-04-29 DIAGNOSIS — C50411 Malignant neoplasm of upper-outer quadrant of right female breast: Secondary | ICD-10-CM

## 2022-04-29 DIAGNOSIS — Z87891 Personal history of nicotine dependence: Secondary | ICD-10-CM | POA: Diagnosis not present

## 2022-04-29 DIAGNOSIS — Z923 Personal history of irradiation: Secondary | ICD-10-CM | POA: Insufficient documentation

## 2022-04-29 DIAGNOSIS — Z170 Estrogen receptor positive status [ER+]: Secondary | ICD-10-CM | POA: Diagnosis not present

## 2022-04-29 DIAGNOSIS — M810 Age-related osteoporosis without current pathological fracture: Secondary | ICD-10-CM | POA: Diagnosis not present

## 2022-04-29 NOTE — Progress Notes (Signed)
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 Oncotype DX 24, intermediate risk.  INTERVAL HISTORY: Patient returns to clinic today for routine 74-monthevaluation.  She recently agreed to take tamoxifen for greater than 5 years secondary to her increased risk of recurrence.  She is tolerating the treatment well without significant side effects.  She complains of chronic arthritis, but otherwise feels well.  She has no neurologic complaints. She denies any recent fevers or illnesses. She has a good appetite and denies weight loss. She denies any chest pain, shortness of breath, cough, or hemoptysis.  She denies any nausea, vomiting, constipation, or diarrhea. She has no urinary complaints.  Patient offers no further specific complaints today.  REVIEW OF SYSTEMS:   Review of Systems  Constitutional: Negative.  Negative for diaphoresis, fever, malaise/fatigue and weight loss.  Respiratory: Negative.  Negative for cough and shortness of breath.   Cardiovascular: Negative.  Negative for chest pain and leg swelling.  Gastrointestinal: Negative.  Negative for abdominal pain.  Genitourinary: Negative.  Negative for dysuria.  Musculoskeletal:  Positive for joint pain. Negative for myalgias.  Skin: Negative.  Negative for rash.  Neurological: Negative.  Negative for sensory change, focal weakness, weakness and headaches.  Psychiatric/Behavioral: Negative.  The patient is not nervous/anxious.     As per HPI. Otherwise, a complete review of systems is negative.  PAST MEDICAL HISTORY: Past Medical History:  Diagnosis Date   Anemia     Anxiety    Arthritis    LOWER BACK   Breast cancer ([facility] 06/2016   right breast INVASIVE MAMMARY CARCINOMA   Cervical dysplasia    DDD (degenerative disc disease), lumbar    Personal history of radiation therapy 2018   right breast ca    PAST SURGICAL HISTORY: Past Surgical History:  Procedure Laterality Date   BREAST BIOPSY Right 06/11/2016   INVASIVE MAMMARY CARCINOMA   BREAST LUMPECTOMY Right 07/08/2016   INVASIVE MAMMARY CARCINOMA, clear margins, negative LN   CERVIX SURGERY     PARTIAL MASTECTOMY WITH NEEDLE LOCALIZATION Right 07/08/2016   Procedure: PARTIAL MASTECTOMY WITH NEEDLE LOCALIZATION;  Surgeon: JLeonie Green MD;  Location: [facility] ORS;  Service: General;  Laterality: Right;   SENTINEL NODE BIOPSY Right 07/08/2016   Procedure: SENTINEL NODE BIOPSY;  Surgeon: JLeonie Green MD;  Location: [facility] OR[facility];  Service: General;  Laterality: Right;   SHOULDER ARTHROSCOPY WITH SUBACROMIAL DECOMPRESSION, ROTATOR CUFF REPAIR AND BICEP TENDON REPAIR Left 02/09/2021   Procedure: SHOULDER ARTHROSCOPY WITH SUBACROMIAL DECOMPRESSION;  Surgeon: BLovell Sheehan MD;  Location: [facility] ORS;  Service: Orthopedics;  Laterality: Left;   WISDOM TOOTH EXTRACTION     AGE 65 OR 11    FAMILY HISTORY: Family History  Problem Relation Age of Onset   Breast cancer Neg Hx     ADVANCED DIRECTIVES (Y/N):  N  HEALTH MAINTENANCE: Social History   Tobacco Use   Smoking status: Former    Years: 2.00    Types: Cigarettes    Quit date: 07/01/1996    Years since quitting: 25.8   Smokeless tobacco: Never   Tobacco comments:    ONLY WEEKEND SMOKER  Vaping Use  Vaping Use: Never used  Substance Use Topics   Alcohol use: No   Drug use: No     Colonoscopy:  PAP:  Bone density:  Lipid panel:  No Known Allergies  Current Outpatient Medications  Medication Sig Dispense Refill   acetaminophen (TYLENOL) 500 MG tablet Take 1,000 mg by mouth every 6 (six) hours as needed for moderate pain  or headache.     ALPRAZolam (XANAX) 0.25 MG tablet Take 1 tablet by mouth daily as needed for anxiety.     tamoxifen (NOLVADEX) 20 MG tablet Take 1 tablet (20 mg total) by mouth daily. 90 tablet 3   traMADol (ULTRAM) 50 MG tablet Take 50 mg by mouth 2 (two) times daily as needed for moderate pain.     zolpidem (AMBIEN) 10 MG tablet Take 10 mg by mouth at bedtime as needed for sleep.     No current facility-administered medications for this visit.    OBJECTIVE: Vitals:   04/29/22 1353  BP: 138/83  Pulse: 78  Resp: 16  Temp: 98.9 F (37.2 C)  SpO2: 99%     Body mass index is 25.75 kg/m.    ECOG FS:0 - Asymptomatic  General: Well-developed, well-nourished, no acute distress. Eyes: Pink conjunctiva, anicteric sclera. HEENT: Normocephalic, moist mucous membranes. Lungs: No audible wheezing or coughing. Heart: Regular rate and rhythm. Abdomen: Soft, nontender, no obvious distention. Musculoskeletal: No edema, cyanosis, or clubbing. Neuro: Alert, answering all questions appropriately. Cranial nerves grossly intact. Skin: No rashes or petechiae noted. Psych: Normal affect.  LAB RESULTS:  Lab Results  Component Value Date   NA 138 02/02/2021   K 3.4 (L) 02/02/2021   CL 104 02/02/2021   CO2 28 02/02/2021   GLUCOSE 105 (H) 02/02/2021   BUN 9 02/02/2021   CREATININE 0.69 02/02/2021   CALCIUM 8.5 (L) 02/02/2021   GFRNONAA >60 02/02/2021    Lab Results  Component Value Date   WBC 6.2 02/02/2021   HGB 10.0 (L) 02/02/2021   HCT 32.1 (L) 02/02/2021   MCV 67.2 (L) 02/02/2021   PLT 170 02/02/2021     STUDIES: DG Bone Density  Result Date: 04/12/2022 EXAM: DUAL X-RAY ABSORPTIOMETRY (DXA) FOR BONE MINERAL DENSITY IMPRESSION: Your patient Erin Stevens completed a BMD test on 04/12/2022 using the [place] (software version: 14.10) manufactured by [employer]. The following summarizes the results of our evaluation. Technologist: MTB PATIENT BIOGRAPHICAL:  Name: Erin Stevens, Erin Stevens Patient ID:  [id number] Birth Date: [birth year] Height:     64.0 in. Gender:      Female Exam Date:  04/12/2022 Weight:     152.0 lbs. Indications: Caucasian, Postmenopausal, History of Radiation, History of Osteoporosis, Breast CA, Low Calcium Intake Fractures: Treatments: Tamoxifen, letrozole DENSITOMETRY RESULTS: Site         Region     Measured Date Measured Age WHO Classification Young Adult T-score BMD         %Change vs. Previous Significant Change (*) AP Spine L1-L3 04/12/2022 64.0 Osteopenia -1.5 0.992 g/cm2 7.7% Yes AP Spine L1-L3 02/04/2020 61.8 Osteopenia -2.1 0.921 g/cm2 -2.4% - AP Spine L1-L3 01/31/2019 60.8 Osteopenia -1.9 0.944 g/cm2 4.0% Yes AP Spine L1-L3 12/21/2017 59.6 Osteopenia -2.2 0.908 g/cm2 1.9% - AP Spine L1-L3 11/23/2016 58.6 Osteopenia -2.4 0.891 g/cm2 - - DualFemur Neck Left 04/12/2022 64.0 Osteopenia -1.9 0.769 g/cm2 -3.0% - DualFemur Neck Left 02/04/2020 61.8 Osteopenia -1.8 0.793 g/cm2 -0.8% - DualFemur Neck Left 01/31/2019 60.8 Osteopenia -1.7 0.799 g/cm2 7.1% Yes  DualFemur Neck Left 12/21/2017 59.6 Osteopenia -2.1 0.746 g/cm2 -4.1% - DualFemur Neck Left 11/23/2016 58.6 Osteopenia -1.9 0.778 g/cm2 - - DualFemur Total Mean 04/12/2022 64.0 Osteopenia -1.3 0.841 g/cm2 4.3% Yes DualFemur Total Mean 02/04/2020 61.8 Osteopenia -1.6 0.806 g/cm2 -0.1% - DualFemur Total Mean 01/31/2019 60.8 Osteopenia -1.6 0.807 g/cm2 1.5% - DualFemur Total Mean 12/21/2017 59.6 Osteopenia -1.7 0.795 g/cm2 2.8% Yes DualFemur Total Mean 11/23/2016 58.6 Osteopenia -1.9 0.773 g/cm2 - - Left Forearm Radius 33% 04/12/2022 64.0 Osteoporosis -2.6 0.645 g/cm2 6.6% Yes Left Forearm Radius 33% 01/31/2019 60.8 Osteoporosis -3.1 0.605 g/cm2 -2.9% - Left Forearm Radius 33% 12/21/2017 59.6 Osteoporosis -2.9 0.623 g/cm2 - - ASSESSMENT: The BMD measured at Forearm Radius 33% is 0.645 g/cm2 with a T-score of -2.6. This patient is considered osteoporotic according to [facility]) criteria.  The scan quality is good. L-4 was excluded due to degenerative changes. Compared with prior study, there has been significant increase in the spine. Compared with prior study, there has been significant increase in the total hip. [facility] [employer]) criteria for post-menopausal, Caucasian Women: Normal:                   T-score at or above -1 SD Osteopenia/low bone mass: T-score between -1 and -2.5 SD Osteoporosis:             T-score at or below -2.5 SD RECOMMENDATIONS: 1. All patients should optimize calcium and vitamin D intake. 2. Consider FDA-approved medical therapies in postmenopausal women and men aged 41 years and older, based on the following: a. A hip or vertebral(clinical or morphometric) fracture b. T-score < -2.5 at the femoral neck or spine after appropriate evaluation to exclude secondary causes c. Low bone mass (T-score between -1.0 and -2.5 at the femoral neck or spine) and a 10-year probability of a hip fracture > 3% or a 10-year probability of a major osteoporosis-related fracture > 20% based on the [place]-adapted WHO algorithm 3. Clinician judgment and/or patient preferences may indicate treatment for people with 10-year fracture probabilities above or below these levels FOLLOW-UP: People with diagnosed cases of osteoporosis or at high risk for fracture should have regular bone mineral density tests. For patients eligible for Medicare, routine testing is allowed once every 2 years. The testing frequency can be increased to one year for patients who have rapidly progressing disease, those who are receiving or discontinuing medical therapy to restore bone mass, or have additional risk factors. I have reviewed this report, and agree with the above findings. [facility] Electronically Signed   By: Ammie Ferrier M.D.   On: 04/12/2022 14:06     ASSESSMENT:  Pathologic stage IA ER/PR positive, HER-2/neu negative invasive carcinoma of the upper outer quadrant of the right  breast. Oncotype DX 24, intermediate risk.  PLAN:    1.  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  Because patient's Oncotype score was intermediate risk, she was offered chemotherapy which she declined. Given the small size of her tumor, it was agreed upon that is likely not necessary. Patient completed adjuvant XRT.  Patient has now discontinued letrozole and anastrozole secondary to side effects.  Patient initiated treatment in approximately July 2018.  Given her increased risk, patient has agreed to continue treatment for 7 to 10 years.  No further intervention is needed.  Patient will require mammogram in August 2024.  Return to  clinic in 6 months for routine evaluation.   2.  Osteoporosis: Patient's most recent bone mineral density on January 2024 reported T-score of -2.6.  Patient continues to be osteoporotic, but declines any bisphosphonate intervention.  She also does not take calcium or vitamin D supplementation.  No further intervention is needed.  Tamoxifen as above.     Patient expressed understanding and was in agreement with this plan. She also understands that She can call clinic at any time with any questions, concerns, or complaints.     Cancer Staging  Primary cancer of upper outer quadrant of right female breast [facility]) Staging form: Breast, AJCC 8th Edition - Clinical stage from 06/15/2016: Stage IA (cT1b, cN0, cM0, G2, ER+, PR+, HER2-) - Signed by Lloyd Huger, MD on 07/24/2016 Histologic grading system: 3 grade system Laterality: Right - Pathologic stage from 07/24/2016: Stage IA (pT1b, pN0, cM0, G2, ER+, PR+, HER2-, Oncotype DX score: 24) - Signed by Lloyd Huger, MD on 07/24/2016 Neoadjuvant therapy: No Multigene prognostic tests performed: Oncotype DX Recurrence score range: Greater than or equal to 11 Histologic grading system: 3 grade  system Laterality: Right   Lloyd Huger, MD   04/29/2022 3:09 PM

## 2022-06-18 ENCOUNTER — Ambulatory Visit
Admission: RE | Admit: 2022-06-18 | Discharge: 2022-06-18 | Disposition: A | Discharge status: Home / Self Care | Payer: 59 | Source: Ambulatory Visit | Attending: Oncology | Admitting: Oncology

## 2022-06-18 DIAGNOSIS — C50411 Malignant neoplasm of upper-outer quadrant of right female breast: Secondary | ICD-10-CM | POA: Insufficient documentation

## 2022-06-18 DIAGNOSIS — Z1231 Encounter for screening mammogram for malignant neoplasm of breast: Secondary | ICD-10-CM | POA: Insufficient documentation

## 2022-09-26 ENCOUNTER — Other Ambulatory Visit: Payer: Self-pay | Admitting: Nurse Practitioner

## 2022-09-27 ENCOUNTER — Encounter: Payer: Self-pay | Admitting: Oncology

## 2022-10-05 ENCOUNTER — Other Ambulatory Visit: Payer: Self-pay | Admitting: Urology

## 2022-10-05 DIAGNOSIS — M1990 Unspecified osteoarthritis, unspecified site: Secondary | ICD-10-CM

## 2022-10-28 ENCOUNTER — Inpatient Hospital Stay: Payer: 59 | Attending: Oncology | Admitting: Oncology

## 2023-02-25 ENCOUNTER — Encounter: Payer: Self-pay | Admitting: Oncology

## 2023-02-25 ENCOUNTER — Inpatient Hospital Stay: Payer: 59 | Attending: Oncology | Admitting: Oncology

## 2023-02-25 ENCOUNTER — Other Ambulatory Visit: Payer: Self-pay | Admitting: Family Medicine

## 2023-02-25 VITALS — BP 147/70 | HR 84 | Temp 98.5°F | Resp 16 | Ht 64.0 in | Wt 154.3 lb

## 2023-02-25 DIAGNOSIS — Z853 Personal history of malignant neoplasm of breast: Secondary | ICD-10-CM

## 2023-02-25 DIAGNOSIS — Z923 Personal history of irradiation: Secondary | ICD-10-CM | POA: Insufficient documentation

## 2023-02-25 DIAGNOSIS — M549 Dorsalgia, unspecified: Secondary | ICD-10-CM | POA: Diagnosis not present

## 2023-02-25 DIAGNOSIS — Z170 Estrogen receptor positive status [ER+]: Secondary | ICD-10-CM | POA: Insufficient documentation

## 2023-02-25 DIAGNOSIS — M8580 Other specified disorders of bone density and structure, unspecified site: Secondary | ICD-10-CM | POA: Diagnosis not present

## 2023-02-25 DIAGNOSIS — C50411 Malignant neoplasm of upper-outer quadrant of right female breast: Secondary | ICD-10-CM | POA: Insufficient documentation

## 2023-02-25 DIAGNOSIS — Z79811 Long term (current) use of aromatase inhibitors: Secondary | ICD-10-CM | POA: Insufficient documentation

## 2023-02-25 DIAGNOSIS — G8929 Other chronic pain: Secondary | ICD-10-CM | POA: Insufficient documentation

## 2023-02-25 DIAGNOSIS — Z08 Encounter for follow-up examination after completed treatment for malignant neoplasm: Secondary | ICD-10-CM | POA: Diagnosis not present

## 2023-02-25 DIAGNOSIS — M810 Age-related osteoporosis without current pathological fracture: Secondary | ICD-10-CM | POA: Insufficient documentation

## 2023-02-25 DIAGNOSIS — I708 Atherosclerosis of other arteries: Secondary | ICD-10-CM

## 2023-02-25 DIAGNOSIS — Z87891 Personal history of nicotine dependence: Secondary | ICD-10-CM | POA: Insufficient documentation

## 2023-02-25 NOTE — Progress Notes (Signed)
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 Oncotype DX 24, intermediate risk.  INTERVAL HISTORY: Patient returns to clinic today for routine 5-month evaluation.  She recently moved to [facility].  She currently feels well and is asymptomatic.  She continues to tolerate tamoxifen without significant side effects.  She has chronic back pain.  She has no neurologic complaints. She denies any recent fevers or illnesses. She has a good appetite and denies weight loss. She denies any chest pain, shortness of breath, cough, or hemoptysis.  She denies any nausea, vomiting, constipation, or diarrhea. She has no urinary complaints.  Patient offers no further specific complaints today.  REVIEW OF SYSTEMS:   Review of Systems  Constitutional: Negative.  Negative for diaphoresis, fever, malaise/fatigue and weight loss.  Respiratory: Negative.  Negative for cough and shortness of breath.   Cardiovascular: Negative.  Negative for chest pain and leg swelling.  Gastrointestinal: Negative.  Negative for abdominal pain.  Genitourinary: Negative.  Negative for dysuria.  Musculoskeletal:  Positive for back pain. Negative for joint pain and myalgias.  Skin: Negative.  Negative for rash.  Neurological: Negative.  Negative for sensory change, focal weakness, weakness and headaches.  Psychiatric/Behavioral: Negative.  The patient is not nervous/anxious.     As per HPI. Otherwise, a complete review of systems is negative.  PAST MEDICAL HISTORY: Past Medical History:  Diagnosis Date   Anemia    Anxiety    Arthritis    LOWER BACK    Breast cancer ([facility]) 06/2016   right breast INVASIVE MAMMARY CARCINOMA   Cervical dysplasia    DDD (degenerative disc disease), lumbar    Personal history of radiation therapy 2018   right breast ca    PAST SURGICAL HISTORY: Past Surgical History:  Procedure Laterality Date   BREAST BIOPSY Right 06/11/2016   INVASIVE MAMMARY CARCINOMA   BREAST LUMPECTOMY Right 07/08/2016   INVASIVE MAMMARY CARCINOMA, clear margins, negative LN   CERVIX SURGERY     PARTIAL MASTECTOMY WITH NEEDLE LOCALIZATION Right 07/08/2016   Procedure: PARTIAL MASTECTOMY WITH NEEDLE LOCALIZATION;  Surgeon: Nadeen Landau, MD;  Location: [facility] ORS;  Service: General;  Laterality: Right;   SENTINEL NODE BIOPSY Right 07/08/2016   Procedure: SENTINEL NODE BIOPSY;  Surgeon: Nadeen Landau, MD;  Location: [facility] ORS;  Service: General;  Laterality: Right;   SHOULDER ARTHROSCOPY WITH SUBACROMIAL DECOMPRESSION, ROTATOR CUFF REPAIR AND BICEP TENDON REPAIR Left 02/09/2021   Procedure: SHOULDER ARTHROSCOPY WITH SUBACROMIAL DECOMPRESSION;  Surgeon: Lyndle Herrlich, MD;  Location: [facility] ORS;  Service: Orthopedics;  Laterality: Left;   WISDOM TOOTH EXTRACTION     AGE 40 OR 11    FAMILY HISTORY: Family History  Problem Relation Age of Onset   Breast cancer Neg Hx     ADVANCED DIRECTIVES (Y/N):  N  HEALTH MAINTENANCE: Social History   Tobacco Use   Smoking status: Former    Current packs/day: 0.00    Types: Cigarettes    Start date: 07/02/1994    Quit date: 07/01/1996    Years since quitting: 26.6   Smokeless tobacco: Never   Tobacco comments:    ONLY WEEKEND SMOKER  Vaping Use   Vaping status: Never Used  Substance Use Topics   Alcohol use: No   Drug use: No     Colonoscopy:  PAP:  Bone density:  Lipid panel:  No Known Allergies  Current Outpatient Medications  Medication Sig Dispense Refill   ALPRAZolam (XANAX) 0.25 MG tablet Take 1 tablet by mouth daily as needed for anxiety.      HYDROcodone-acetaminophen (NORCO) 10-325 MG tablet Take 1 tablet by mouth every 6 (six) hours as needed.     tamoxifen (NOLVADEX) 20 MG tablet TAKE 1 TABLET BY MOUTH EVERY DAY 30 tablet 11   traMADol (ULTRAM) 50 MG tablet Take 50 mg by mouth 2 (two) times daily as needed for moderate pain.     zolpidem (AMBIEN) 10 MG tablet Take 10 mg by mouth at bedtime as needed for sleep.     acetaminophen (TYLENOL) 500 MG tablet Take 1,000 mg by mouth every 6 (six) hours as needed for moderate pain or headache.     No current facility-administered medications for this visit.    OBJECTIVE: Vitals:   02/25/23 1113  BP: (!) 147/70  Pulse: 84  Resp: 16  Temp: 98.5 F (36.9 C)  SpO2: 99%     Body mass index is 26.49 kg/m.    ECOG FS:0 - Asymptomatic  General: Well-developed, well-nourished, no acute distress. Eyes: Pink conjunctiva, anicteric sclera. HEENT: Normocephalic, moist mucous membranes. Lungs: No audible wheezing or coughing. Heart: Regular rate and rhythm. Abdomen: Soft, nontender, no obvious distention. Musculoskeletal: No edema, cyanosis, or clubbing. Neuro: Alert, answering all questions appropriately. Cranial nerves grossly intact. Skin: No rashes or petechiae noted. Psych: Normal affect.  LAB RESULTS:  Lab Results  Component Value Date   NA 138 02/02/2021   K 3.4 (L) 02/02/2021   CL 104 02/02/2021   CO2 28 02/02/2021   GLUCOSE 105 (H) 02/02/2021   BUN 9 02/02/2021   CREATININE 0.69 02/02/2021   CALCIUM 8.5 (L) 02/02/2021   GFRNONAA >60 02/02/2021    Lab Results  Component Value Date   WBC 6.2 02/02/2021   HGB 10.0 (L) 02/02/2021   HCT 32.1 (L) 02/02/2021   MCV 67.2 (L) 02/02/2021   PLT 170 02/02/2021     STUDIES: No results found.   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 Oncotype DX 24, intermediate risk.  PLAN:    Pathologic stage IA ER/PR positive, HER-2/neu  negative invasive carcinoma of the upper outer quadrant of the right breast: Oncotype DX 24, intermediate risk.  Because patient's Oncotype score was intermediate risk, she was offered chemotherapy which she declined. Given the small size of her tumor, it was agreed upon that is likely not necessary. Patient completed adjuvant XRT.  Patient has now discontinued letrozole and anastrozole secondary to side effects.  Patient initiated treatment in approximately July 2018.  Given her increased risk, patient has agreed to continue treatment for a total of 7 years completing in July 2025.  No further interventions are needed.  Patient's most recent mammogram on June 18, 2022 was reported as BI-RADS 1.  Repeat in March 2025.  Patient will have video-assisted telemedicine visit in 6 months.   Osteoporosis: Patient's most recent bone mineral density on April 12, 2022 reported T-score of -2.6.  Patient continues to be osteoporotic, but declines any bisphosphonate intervention.  She also does not take calcium or vitamin D supplementation.  Repeat bone mineral density  along with mammogram as above.  I spent a total of 20 minutes reviewing chart data, face-to-face evaluation with the patient, counseling and coordination of care as detailed above.    Patient expressed understanding and was in agreement with this plan. She also understands that She can call clinic at any time with any questions, concerns, or complaints.     Cancer Staging  Primary cancer of upper outer quadrant of right female breast [facility]) Staging form: Breast, AJCC 8th Edition - Clinical stage from 06/15/2016: Stage IA (cT1b, cN0, cM0, G2, ER+, PR+, HER2-) - Signed by Jeralyn Ruths, MD on 07/24/2016 Histologic grading system: 3 grade system Laterality: Right - Pathologic stage from 07/24/2016: Stage IA (pT1b, pN0, cM0, G2, ER+, PR+, HER2-, Oncotype DX score: 24) - Signed by Jeralyn Ruths, MD on 07/24/2016 Neoadjuvant therapy:  No Multigene prognostic tests performed: Oncotype DX Recurrence score range: Greater than or equal to 11 Histologic grading system: 3 grade system Laterality: Right   Jeralyn Ruths, MD   02/25/2023 1:17 PM

## 2023-03-22 ENCOUNTER — Other Ambulatory Visit: Payer: 59

## 2023-06-14 ENCOUNTER — Encounter: Payer: Self-pay | Admitting: Oncology

## 2023-06-19 ENCOUNTER — Encounter: Payer: Self-pay | Admitting: Oncology

## 2023-06-21 ENCOUNTER — Ambulatory Visit
Admission: RE | Admit: 2023-06-21 | Discharge: 2023-06-21 | Disposition: A | Discharge status: Home / Self Care | Payer: Self-pay | Source: Ambulatory Visit | Attending: Oncology | Admitting: Oncology

## 2023-06-21 DIAGNOSIS — Z1231 Encounter for screening mammogram for malignant neoplasm of breast: Secondary | ICD-10-CM | POA: Insufficient documentation

## 2023-06-21 DIAGNOSIS — Z853 Personal history of malignant neoplasm of breast: Secondary | ICD-10-CM | POA: Insufficient documentation

## 2023-06-21 DIAGNOSIS — I708 Atherosclerosis of other arteries: Secondary | ICD-10-CM | POA: Insufficient documentation

## 2023-06-21 DIAGNOSIS — Z08 Encounter for follow-up examination after completed treatment for malignant neoplasm: Secondary | ICD-10-CM | POA: Insufficient documentation

## 2023-06-21 DIAGNOSIS — C50411 Malignant neoplasm of upper-outer quadrant of right female breast: Secondary | ICD-10-CM | POA: Insufficient documentation

## 2023-06-21 DIAGNOSIS — I700 Atherosclerosis of aorta: Secondary | ICD-10-CM | POA: Insufficient documentation

## 2023-07-12 ENCOUNTER — Encounter: Payer: Self-pay | Admitting: Oncology

## 2023-08-09 ENCOUNTER — Other Ambulatory Visit: Payer: 59

## 2023-08-25 ENCOUNTER — Telehealth: Payer: 59 | Admitting: Oncology

## 2023-09-14 ENCOUNTER — Telehealth: Payer: Self-pay | Admitting: *Deleted

## 2023-09-14 NOTE — Telephone Encounter (Signed)
 Patient calling and she moved to [employer] and [place] to have [place] get fax [place] [facility]. I called the patient and left her a fax number [phone]

## 2023-09-15 ENCOUNTER — Encounter: Payer: Self-pay | Admitting: Oncology

## 2023-09-22 ENCOUNTER — Other Ambulatory Visit: Payer: Self-pay

## 2023-09-23 ENCOUNTER — Telehealth: Payer: Self-pay | Admitting: Oncology

## 2023-09-26 ENCOUNTER — Other Ambulatory Visit: Payer: Self-pay | Admitting: Oncology

## 2023-09-26 ENCOUNTER — Inpatient Hospital Stay: Payer: Self-pay | Attending: Oncology | Admitting: Oncology

## 2023-09-26 DIAGNOSIS — C50411 Malignant neoplasm of upper-outer quadrant of right female breast: Secondary | ICD-10-CM | POA: Diagnosis not present

## 2023-09-26 DIAGNOSIS — M8580 Other specified disorders of bone density and structure, unspecified site: Secondary | ICD-10-CM | POA: Diagnosis not present

## 2023-09-26 MED ORDER — TAMOXIFEN CITRATE 20 MG PO TABS: 20.0000 mg | ORAL_TABLET | Freq: Every day | ORAL | 0 refills | Status: AC

## 2023-09-26 NOTE — Progress Notes (Signed)
 [facility]  [phone] [phone]  ID: Erin Stevens OB: May 15, 1960  MR#: [id number]  RDW#:[id number]  Patient Care Team: Valora Agent, MD as PCP - General (Family Medicine) Jacobo Evalene PARAS, MD as Consulting Physician (Oncology)   I connected with Erin Stevens on 09/26/23 at  3:30 PM EDT by video enabled telemedicine visit and verified that I am speaking with the correct person using two identifiers.   I discussed the limitations, risks, security and privacy concerns of performing an evaluation and management service by telemedicine and the availability of in-person appointments. I also discussed with the patient that there may be a patient responsible charge related to this service. The patient expressed understanding and agreed to proceed.   Other persons participating in the visit and their role in the encounter: Patient, MD.  Patient's location: Home. Provider's location: Clinic.  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 Oncotype DX 24, intermediate risk.  INTERVAL HISTORY: Patient agreed to video-assisted telemedicine visit for routine 65-month evaluation.  She continues to live in [facility].  She is tolerating tamoxifen  without significant side effects.  She currently feels well and is asymptomatic.  She has no neurologic complaints. She denies any recent fevers or illnesses. She has a good appetite and denies weight loss. She denies any chest pain, shortness of breath, cough, or hemoptysis.  She denies any nausea, vomiting, constipation, or diarrhea. She has no urinary complaints.  Patient offers no specific complaints today.  REVIEW OF SYSTEMS:   Review of Systems  Constitutional: Negative.  Negative for diaphoresis, fever, malaise/fatigue and weight loss.  Respiratory: Negative.  Negative for cough and shortness of breath.    Cardiovascular: Negative.  Negative for chest pain and leg swelling.  Gastrointestinal: Negative.  Negative for abdominal pain.  Genitourinary: Negative.  Negative for dysuria.  Musculoskeletal:  Positive for back pain. Negative for joint pain and myalgias.  Skin: Negative.  Negative for rash.  Neurological: Negative.  Negative for sensory change, focal weakness, weakness and headaches.  Psychiatric/Behavioral: Negative.  The patient is not nervous/anxious.     As per HPI. Otherwise, a complete review of systems is negative.  PAST MEDICAL HISTORY: Past Medical History:  Diagnosis Date   Anemia    Anxiety    Arthritis    LOWER BACK   Breast cancer (HCC) 06/2016   right breast INVASIVE MAMMARY CARCINOMA   Cervical dysplasia    DDD (degenerative disc disease), lumbar    Personal history of radiation therapy 2018   right breast ca    PAST SURGICAL HISTORY: Past Surgical History:  Procedure Laterality Date   BREAST BIOPSY Right 06/11/2016   INVASIVE MAMMARY CARCINOMA   BREAST LUMPECTOMY Right 07/08/2016   INVASIVE MAMMARY CARCINOMA, clear margins, negative LN   CERVIX SURGERY     PARTIAL MASTECTOMY WITH NEEDLE LOCALIZATION Right 07/08/2016   Procedure: PARTIAL MASTECTOMY WITH NEEDLE LOCALIZATION;  Surgeon: Larinda Unknown Sharps, MD;  Location: [facility] ORS;  Service: General;  Laterality: Right;   SENTINEL NODE BIOPSY Right 07/08/2016   Procedure: SENTINEL NODE BIOPSY;  Surgeon: Larinda Unknown Sharps, MD;  Location: [facility];  Service: General;  Laterality: Right;   SHOULDER ARTHROSCOPY WITH SUBACROMIAL DECOMPRESSION, ROTATOR CUFF REPAIR AND BICEP TENDON REPAIR Left 02/09/2021   Procedure: SHOULDER ARTHROSCOPY WITH SUBACROMIAL DECOMPRESSION;  Surgeon: Leora Agent SAUNDERS, MD;  Location: [facility];  Service: Orthopedics;  Laterality: Left;  WISDOM TOOTH EXTRACTION     AGE 63 OR 63    FAMILY HISTORY: Family History  Problem Relation Age of Onset   Breast cancer Neg Hx     ADVANCED  DIRECTIVES (Y/N):  N  HEALTH MAINTENANCE: Social History   Tobacco Use   Smoking status: Former    Current packs/day: 0.00    Types: Cigarettes    Start date: 07/02/1994    Quit date: 07/01/1996    Years since quitting: 27.2   Smokeless tobacco: Never   Tobacco comments:    ONLY WEEKEND SMOKER  Vaping Use   Vaping status: Never Used  Substance Use Topics   Alcohol use: No   Drug use: No     Colonoscopy:  PAP:  Bone density:  Lipid panel:  No Known Allergies  Current Outpatient Medications  Medication Sig Dispense Refill   acetaminophen  (TYLENOL ) 500 MG tablet Take 1,000 mg by mouth every 6 (six) hours as needed for moderate pain or headache.     ALPRAZolam (XANAX) 0.25 MG tablet Take 1 tablet by mouth daily as needed for anxiety.     HYDROcodone -acetaminophen  (NORCO) 10-325 MG tablet Take 1 tablet by mouth every 6 (six) hours as needed.     tamoxifen  (NOLVADEX ) 20 MG tablet Take 1 tablet (20 mg total) by mouth daily. 30 tablet 0   traMADol (ULTRAM) 50 MG tablet Take 50 mg by mouth 2 (two) times daily as needed for moderate pain.     zolpidem (AMBIEN) 10 MG tablet Take 10 mg by mouth at bedtime as needed for sleep.     No current facility-administered medications for this visit.    OBJECTIVE: There were no vitals filed for this visit.    There is no height or weight on file to calculate BMI.    ECOG FS:0 - Asymptomatic  General: Well-developed, well-nourished, no acute distress. HEENT: Normocephalic. Neuro: Alert, answering all questions appropriately. Cranial nerves grossly intact. Psych: Normal affect.  LAB RESULTS:  Lab Results  Component Value Date   NA 138 02/02/2021   K 3.4 (L) 02/02/2021   CL 104 02/02/2021   CO2 28 02/02/2021   GLUCOSE 105 (H) 02/02/2021   BUN 9 02/02/2021   CREATININE 0.69 02/02/2021   CALCIUM 8.5 (L) 02/02/2021   GFRNONAA >60 02/02/2021    Lab Results  Component Value Date   WBC 6.2 02/02/2021   HGB 10.0 (L) 02/02/2021   HCT  32.1 (L) 02/02/2021   MCV 67.2 (L) 02/02/2021   PLT 170 02/02/2021     STUDIES: No results found.   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 Oncotype DX 24, intermediate risk.  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  Because patient's Oncotype score was intermediate risk, she was offered chemotherapy which she declined. Given the small size of her tumor, it was agreed upon that is likely not necessary. Patient completed adjuvant XRT.  Patient has now discontinued letrozole  and anastrozole  secondary to side effects.  Patient initiated treatment in approximately July 2018.  Given her increased risk, patient has agreed to continue treatment for a total of 7 years completing in July 2025.  Patient was given 1 refill of tamoxifen  for 30 additional days and then instructed to discontinue treatment.  Her most recent mammogram on June 21, 2023  was reported BI-RADS 1.  Repeat in March 2026.  This will be ordered by her local physician in [facility].  She will have video-assisted telemedicine visit in March 2026 after her mammogram at which point she can likely be discharged from clinic.  Osteopenia: Patient's most recent bone mineral density on September 12, 2023 reported osteopenia which is improved from previous.  Patient continues to decline bisphosphonate intervention, but has agreed to calcium and vitamin D supplementation.    I spent a total of 20 minutes reviewing chart data, face-to-face evaluation with the patient, counseling and coordination of care as detailed above.   Patient expressed understanding and was in agreement with this plan. She also understands that She can call clinic at any time with any questions, concerns, or complaints.     Cancer Staging  Primary  cancer of upper outer quadrant of right female breast [facility]) Staging form: Breast, AJCC 8th Edition - Clinical stage from 06/15/2016: Stage IA (cT1b, cN0, cM0, G2, ER+, PR+, HER2-) - Signed by Jacobo Evalene PARAS, MD on 07/24/2016 Histologic grading system: 3 grade system Laterality: Right - Pathologic stage from 07/24/2016: Stage IA (pT1b, pN0, cM0, G2, ER+, PR+, HER2-, Oncotype DX score: 24) - Signed by Jacobo Evalene PARAS, MD on 07/24/2016 Neoadjuvant therapy: No Multigene prognostic tests performed: Oncotype DX Recurrence score range: Greater than or equal to 11 Histologic grading system: 3 grade system Laterality: Right   Evalene PARAS Jacobo, MD   09/26/2023 2:25 PM

## 2023-09-29 ENCOUNTER — Encounter: Payer: Self-pay | Admitting: Oncology

## 2023-10-19 ENCOUNTER — Other Ambulatory Visit: Payer: Self-pay | Admitting: Oncology

## 2023-12-13 ENCOUNTER — Encounter: Payer: Self-pay | Admitting: Oncology

## 2024-05-09 ENCOUNTER — Other Ambulatory Visit: Payer: Self-pay | Admitting: General Surgery

## 2024-05-09 DIAGNOSIS — Z1231 Encounter for screening mammogram for malignant neoplasm of breast: Secondary | ICD-10-CM

## 2024-06-21 ENCOUNTER — Encounter

## 2024-06-28 ENCOUNTER — Inpatient Hospital Stay: Admitting: Oncology

## 2024-07-03 ENCOUNTER — Telehealth: Admitting: Oncology
